# Patient Record
Sex: Male | Born: 1950
Health system: Southern US, Community
[De-identification: ages and names within clinical notes are randomized; demographics above are authoritative.]

## PROBLEM LIST (undated history)

## (undated) DIAGNOSIS — G4730 Sleep apnea, unspecified: Secondary | ICD-10-CM

## (undated) DIAGNOSIS — I4892 Unspecified atrial flutter: Secondary | ICD-10-CM

## (undated) DIAGNOSIS — E119 Type 2 diabetes mellitus without complications: Secondary | ICD-10-CM

## (undated) HISTORY — PX: OTHER SURGICAL HISTORY: SHX169

## (undated) HISTORY — DX: Sleep apnea, unspecified: G47.30

## (undated) HISTORY — DX: Type 2 diabetes mellitus without complications: E11.9

## (undated) HISTORY — PX: TONSILLECTOMY: SUR1361

## (undated) HISTORY — DX: Unspecified atrial flutter: I48.92

---

## 2010-07-08 ENCOUNTER — Other Ambulatory Visit (HOSPITAL_COMMUNITY): Payer: Self-pay | Admitting: Physical Medicine and Rehabilitation

## 2010-07-08 DIAGNOSIS — M545 Low back pain, unspecified: Secondary | ICD-10-CM

## 2010-07-16 ENCOUNTER — Other Ambulatory Visit (HOSPITAL_COMMUNITY): Payer: Self-pay

## 2010-09-10 ENCOUNTER — Other Ambulatory Visit: Payer: Self-pay | Admitting: Physical Medicine and Rehabilitation

## 2010-09-10 ENCOUNTER — Ambulatory Visit
Admission: RE | Admit: 2010-09-10 | Discharge: 2010-09-10 | Disposition: A | Discharge status: Home / Self Care | Payer: PRIVATE HEALTH INSURANCE | Source: Ambulatory Visit | Attending: Physical Medicine and Rehabilitation | Admitting: Physical Medicine and Rehabilitation

## 2010-09-10 DIAGNOSIS — IMO0002 Reserved for concepts with insufficient information to code with codable children: Secondary | ICD-10-CM

## 2016-04-11 DIAGNOSIS — R000 Tachycardia, unspecified: Secondary | ICD-10-CM | POA: Diagnosis not present

## 2016-04-11 DIAGNOSIS — Z23 Encounter for immunization: Secondary | ICD-10-CM | POA: Diagnosis not present

## 2016-04-11 DIAGNOSIS — E871 Hypo-osmolality and hyponatremia: Secondary | ICD-10-CM | POA: Diagnosis not present

## 2016-04-11 DIAGNOSIS — Z9114 Patient's other noncompliance with medication regimen: Secondary | ICD-10-CM | POA: Diagnosis not present

## 2016-04-11 DIAGNOSIS — R69 Illness, unspecified: Secondary | ICD-10-CM | POA: Diagnosis not present

## 2016-04-11 DIAGNOSIS — R05 Cough: Secondary | ICD-10-CM | POA: Diagnosis not present

## 2016-04-11 DIAGNOSIS — R1110 Vomiting, unspecified: Secondary | ICD-10-CM | POA: Diagnosis not present

## 2016-04-11 DIAGNOSIS — J189 Pneumonia, unspecified organism: Secondary | ICD-10-CM | POA: Diagnosis not present

## 2016-04-11 DIAGNOSIS — K5900 Constipation, unspecified: Secondary | ICD-10-CM | POA: Diagnosis not present

## 2016-04-11 DIAGNOSIS — E785 Hyperlipidemia, unspecified: Secondary | ICD-10-CM | POA: Diagnosis not present

## 2016-04-11 DIAGNOSIS — E1310 Other specified diabetes mellitus with ketoacidosis without coma: Secondary | ICD-10-CM | POA: Diagnosis not present

## 2016-04-11 DIAGNOSIS — R112 Nausea with vomiting, unspecified: Secondary | ICD-10-CM | POA: Diagnosis not present

## 2016-04-11 DIAGNOSIS — Z794 Long term (current) use of insulin: Secondary | ICD-10-CM | POA: Diagnosis not present

## 2016-04-11 DIAGNOSIS — R350 Frequency of micturition: Secondary | ICD-10-CM | POA: Diagnosis not present

## 2016-04-11 DIAGNOSIS — E1110 Type 2 diabetes mellitus with ketoacidosis without coma: Secondary | ICD-10-CM | POA: Diagnosis not present

## 2016-04-11 DIAGNOSIS — R809 Proteinuria, unspecified: Secondary | ICD-10-CM | POA: Diagnosis not present

## 2016-04-12 DIAGNOSIS — R112 Nausea with vomiting, unspecified: Secondary | ICD-10-CM | POA: Diagnosis not present

## 2016-04-12 DIAGNOSIS — J189 Pneumonia, unspecified organism: Secondary | ICD-10-CM | POA: Diagnosis not present

## 2016-04-12 DIAGNOSIS — R809 Proteinuria, unspecified: Secondary | ICD-10-CM | POA: Diagnosis not present

## 2016-04-12 DIAGNOSIS — E1310 Other specified diabetes mellitus with ketoacidosis without coma: Secondary | ICD-10-CM | POA: Diagnosis not present

## 2016-04-12 DIAGNOSIS — E871 Hypo-osmolality and hyponatremia: Secondary | ICD-10-CM | POA: Diagnosis not present

## 2016-04-13 DIAGNOSIS — R809 Proteinuria, unspecified: Secondary | ICD-10-CM | POA: Diagnosis not present

## 2016-04-13 DIAGNOSIS — E1310 Other specified diabetes mellitus with ketoacidosis without coma: Secondary | ICD-10-CM | POA: Diagnosis not present

## 2016-04-13 DIAGNOSIS — R112 Nausea with vomiting, unspecified: Secondary | ICD-10-CM | POA: Diagnosis not present

## 2016-04-13 DIAGNOSIS — E871 Hypo-osmolality and hyponatremia: Secondary | ICD-10-CM | POA: Diagnosis not present

## 2016-09-11 DIAGNOSIS — E1165 Type 2 diabetes mellitus with hyperglycemia: Secondary | ICD-10-CM | POA: Diagnosis not present

## 2016-09-11 DIAGNOSIS — E7800 Pure hypercholesterolemia, unspecified: Secondary | ICD-10-CM | POA: Diagnosis not present

## 2016-09-11 DIAGNOSIS — I10 Essential (primary) hypertension: Secondary | ICD-10-CM | POA: Diagnosis not present

## 2016-09-11 DIAGNOSIS — G4730 Sleep apnea, unspecified: Secondary | ICD-10-CM | POA: Diagnosis not present

## 2016-11-13 DIAGNOSIS — Z794 Long term (current) use of insulin: Secondary | ICD-10-CM | POA: Diagnosis not present

## 2016-11-13 DIAGNOSIS — Z888 Allergy status to other drugs, medicaments and biological substances status: Secondary | ICD-10-CM | POA: Diagnosis not present

## 2016-11-13 DIAGNOSIS — R112 Nausea with vomiting, unspecified: Secondary | ICD-10-CM | POA: Diagnosis not present

## 2016-11-13 DIAGNOSIS — Z23 Encounter for immunization: Secondary | ICD-10-CM | POA: Diagnosis not present

## 2016-11-13 DIAGNOSIS — Z720 Tobacco use: Secondary | ICD-10-CM | POA: Diagnosis not present

## 2016-11-13 DIAGNOSIS — R69 Illness, unspecified: Secondary | ICD-10-CM | POA: Diagnosis not present

## 2016-11-13 DIAGNOSIS — E1310 Other specified diabetes mellitus with ketoacidosis without coma: Secondary | ICD-10-CM | POA: Diagnosis not present

## 2016-11-13 DIAGNOSIS — E1110 Type 2 diabetes mellitus with ketoacidosis without coma: Secondary | ICD-10-CM | POA: Diagnosis not present

## 2016-11-13 DIAGNOSIS — E871 Hypo-osmolality and hyponatremia: Secondary | ICD-10-CM | POA: Diagnosis not present

## 2016-11-13 DIAGNOSIS — Z91128 Patient's intentional underdosing of medication regimen for other reason: Secondary | ICD-10-CM | POA: Diagnosis not present

## 2016-11-13 DIAGNOSIS — T50906A Underdosing of unspecified drugs, medicaments and biological substances, initial encounter: Secondary | ICD-10-CM | POA: Diagnosis not present

## 2016-11-13 DIAGNOSIS — Z79899 Other long term (current) drug therapy: Secondary | ICD-10-CM | POA: Diagnosis not present

## 2016-11-13 DIAGNOSIS — R05 Cough: Secondary | ICD-10-CM | POA: Diagnosis not present

## 2016-11-13 DIAGNOSIS — D72829 Elevated white blood cell count, unspecified: Secondary | ICD-10-CM | POA: Diagnosis not present

## 2016-11-25 DIAGNOSIS — Z0000 Encounter for general adult medical examination without abnormal findings: Secondary | ICD-10-CM | POA: Diagnosis not present

## 2016-11-25 DIAGNOSIS — E1165 Type 2 diabetes mellitus with hyperglycemia: Secondary | ICD-10-CM | POA: Diagnosis not present

## 2016-11-25 DIAGNOSIS — Z125 Encounter for screening for malignant neoplasm of prostate: Secondary | ICD-10-CM | POA: Diagnosis not present

## 2016-11-25 DIAGNOSIS — E559 Vitamin D deficiency, unspecified: Secondary | ICD-10-CM | POA: Diagnosis not present

## 2016-11-30 DIAGNOSIS — Z23 Encounter for immunization: Secondary | ICD-10-CM | POA: Diagnosis not present

## 2016-11-30 DIAGNOSIS — Z0001 Encounter for general adult medical examination with abnormal findings: Secondary | ICD-10-CM | POA: Diagnosis not present

## 2016-12-02 DIAGNOSIS — E1165 Type 2 diabetes mellitus with hyperglycemia: Secondary | ICD-10-CM | POA: Diagnosis not present

## 2016-12-02 DIAGNOSIS — R69 Illness, unspecified: Secondary | ICD-10-CM | POA: Diagnosis not present

## 2016-12-02 DIAGNOSIS — E7800 Pure hypercholesterolemia, unspecified: Secondary | ICD-10-CM | POA: Diagnosis not present

## 2016-12-02 DIAGNOSIS — I10 Essential (primary) hypertension: Secondary | ICD-10-CM | POA: Diagnosis not present

## 2016-12-03 DIAGNOSIS — R69 Illness, unspecified: Secondary | ICD-10-CM | POA: Diagnosis not present

## 2016-12-08 DIAGNOSIS — M50123 Cervical disc disorder at C6-C7 level with radiculopathy: Secondary | ICD-10-CM | POA: Diagnosis not present

## 2016-12-08 DIAGNOSIS — M5382 Other specified dorsopathies, cervical region: Secondary | ICD-10-CM | POA: Diagnosis not present

## 2016-12-08 DIAGNOSIS — G8929 Other chronic pain: Secondary | ICD-10-CM | POA: Diagnosis not present

## 2016-12-08 DIAGNOSIS — M542 Cervicalgia: Secondary | ICD-10-CM | POA: Diagnosis not present

## 2016-12-08 DIAGNOSIS — M545 Low back pain: Secondary | ICD-10-CM | POA: Diagnosis not present

## 2016-12-08 DIAGNOSIS — M5412 Radiculopathy, cervical region: Secondary | ICD-10-CM | POA: Diagnosis not present

## 2016-12-11 DIAGNOSIS — Z1211 Encounter for screening for malignant neoplasm of colon: Secondary | ICD-10-CM | POA: Diagnosis not present

## 2016-12-16 DIAGNOSIS — M5412 Radiculopathy, cervical region: Secondary | ICD-10-CM | POA: Diagnosis not present

## 2016-12-16 DIAGNOSIS — E1165 Type 2 diabetes mellitus with hyperglycemia: Secondary | ICD-10-CM | POA: Diagnosis not present

## 2016-12-16 DIAGNOSIS — M542 Cervicalgia: Secondary | ICD-10-CM | POA: Diagnosis not present

## 2016-12-16 DIAGNOSIS — G8929 Other chronic pain: Secondary | ICD-10-CM | POA: Diagnosis not present

## 2016-12-16 DIAGNOSIS — I10 Essential (primary) hypertension: Secondary | ICD-10-CM | POA: Diagnosis not present

## 2016-12-16 DIAGNOSIS — M545 Low back pain: Secondary | ICD-10-CM | POA: Diagnosis not present

## 2016-12-24 DIAGNOSIS — R69 Illness, unspecified: Secondary | ICD-10-CM | POA: Diagnosis not present

## 2017-01-01 DIAGNOSIS — M8589 Other specified disorders of bone density and structure, multiple sites: Secondary | ICD-10-CM | POA: Diagnosis not present

## 2017-01-01 DIAGNOSIS — J301 Allergic rhinitis due to pollen: Secondary | ICD-10-CM | POA: Diagnosis not present

## 2017-01-01 DIAGNOSIS — Z87891 Personal history of nicotine dependence: Secondary | ICD-10-CM | POA: Diagnosis not present

## 2017-01-01 DIAGNOSIS — R942 Abnormal results of pulmonary function studies: Secondary | ICD-10-CM | POA: Diagnosis not present

## 2017-01-01 DIAGNOSIS — R69 Illness, unspecified: Secondary | ICD-10-CM | POA: Diagnosis not present

## 2017-01-01 DIAGNOSIS — G4733 Obstructive sleep apnea (adult) (pediatric): Secondary | ICD-10-CM | POA: Diagnosis not present

## 2017-01-01 DIAGNOSIS — M859 Disorder of bone density and structure, unspecified: Secondary | ICD-10-CM | POA: Diagnosis not present

## 2017-01-18 DIAGNOSIS — M545 Low back pain: Secondary | ICD-10-CM | POA: Diagnosis not present

## 2017-01-18 DIAGNOSIS — G8929 Other chronic pain: Secondary | ICD-10-CM | POA: Diagnosis not present

## 2017-01-18 DIAGNOSIS — M5412 Radiculopathy, cervical region: Secondary | ICD-10-CM | POA: Diagnosis not present

## 2017-01-18 DIAGNOSIS — M542 Cervicalgia: Secondary | ICD-10-CM | POA: Diagnosis not present

## 2017-01-22 DIAGNOSIS — I10 Essential (primary) hypertension: Secondary | ICD-10-CM | POA: Diagnosis not present

## 2017-01-22 DIAGNOSIS — E559 Vitamin D deficiency, unspecified: Secondary | ICD-10-CM | POA: Diagnosis not present

## 2017-01-22 DIAGNOSIS — E1165 Type 2 diabetes mellitus with hyperglycemia: Secondary | ICD-10-CM | POA: Diagnosis not present

## 2017-01-25 DIAGNOSIS — M5412 Radiculopathy, cervical region: Secondary | ICD-10-CM | POA: Diagnosis not present

## 2017-01-25 DIAGNOSIS — G8929 Other chronic pain: Secondary | ICD-10-CM | POA: Diagnosis not present

## 2017-01-25 DIAGNOSIS — M545 Low back pain: Secondary | ICD-10-CM | POA: Diagnosis not present

## 2017-01-25 DIAGNOSIS — M542 Cervicalgia: Secondary | ICD-10-CM | POA: Diagnosis not present

## 2017-01-27 DIAGNOSIS — H9193 Unspecified hearing loss, bilateral: Secondary | ICD-10-CM | POA: Diagnosis not present

## 2017-01-27 DIAGNOSIS — H6123 Impacted cerumen, bilateral: Secondary | ICD-10-CM | POA: Diagnosis not present

## 2017-01-28 DIAGNOSIS — R0602 Shortness of breath: Secondary | ICD-10-CM | POA: Diagnosis not present

## 2017-01-28 DIAGNOSIS — M545 Low back pain: Secondary | ICD-10-CM | POA: Diagnosis not present

## 2017-01-28 DIAGNOSIS — M542 Cervicalgia: Secondary | ICD-10-CM | POA: Diagnosis not present

## 2017-01-28 DIAGNOSIS — R69 Illness, unspecified: Secondary | ICD-10-CM | POA: Diagnosis not present

## 2017-01-28 DIAGNOSIS — G8929 Other chronic pain: Secondary | ICD-10-CM | POA: Diagnosis not present

## 2017-01-28 DIAGNOSIS — E119 Type 2 diabetes mellitus without complications: Secondary | ICD-10-CM | POA: Diagnosis not present

## 2017-01-28 DIAGNOSIS — Z87891 Personal history of nicotine dependence: Secondary | ICD-10-CM | POA: Diagnosis not present

## 2017-01-28 DIAGNOSIS — M5412 Radiculopathy, cervical region: Secondary | ICD-10-CM | POA: Diagnosis not present

## 2017-02-02 DIAGNOSIS — H6993 Unspecified Eustachian tube disorder, bilateral: Secondary | ICD-10-CM | POA: Diagnosis not present

## 2017-02-02 DIAGNOSIS — H903 Sensorineural hearing loss, bilateral: Secondary | ICD-10-CM | POA: Diagnosis not present

## 2017-02-02 DIAGNOSIS — J300 Vasomotor rhinitis: Secondary | ICD-10-CM | POA: Diagnosis not present

## 2017-02-03 DIAGNOSIS — Z1211 Encounter for screening for malignant neoplasm of colon: Secondary | ICD-10-CM | POA: Diagnosis not present

## 2017-02-05 DIAGNOSIS — M5412 Radiculopathy, cervical region: Secondary | ICD-10-CM | POA: Diagnosis not present

## 2017-02-05 DIAGNOSIS — R69 Illness, unspecified: Secondary | ICD-10-CM | POA: Diagnosis not present

## 2017-02-05 DIAGNOSIS — M4316 Spondylolisthesis, lumbar region: Secondary | ICD-10-CM | POA: Diagnosis not present

## 2017-02-05 DIAGNOSIS — M545 Low back pain: Secondary | ICD-10-CM | POA: Diagnosis not present

## 2017-02-05 DIAGNOSIS — M5136 Other intervertebral disc degeneration, lumbar region: Secondary | ICD-10-CM | POA: Diagnosis not present

## 2017-02-05 DIAGNOSIS — M542 Cervicalgia: Secondary | ICD-10-CM | POA: Diagnosis not present

## 2017-02-05 DIAGNOSIS — G8929 Other chronic pain: Secondary | ICD-10-CM | POA: Diagnosis not present

## 2017-02-08 DIAGNOSIS — Z87891 Personal history of nicotine dependence: Secondary | ICD-10-CM | POA: Diagnosis not present

## 2017-02-12 DIAGNOSIS — J449 Chronic obstructive pulmonary disease, unspecified: Secondary | ICD-10-CM | POA: Diagnosis not present

## 2017-02-12 DIAGNOSIS — J301 Allergic rhinitis due to pollen: Secondary | ICD-10-CM | POA: Diagnosis not present

## 2017-02-14 DIAGNOSIS — M4804 Spinal stenosis, thoracic region: Secondary | ICD-10-CM | POA: Diagnosis not present

## 2017-02-14 DIAGNOSIS — M48061 Spinal stenosis, lumbar region without neurogenic claudication: Secondary | ICD-10-CM | POA: Diagnosis not present

## 2017-02-16 DIAGNOSIS — Z01818 Encounter for other preprocedural examination: Secondary | ICD-10-CM | POA: Diagnosis not present

## 2017-02-16 DIAGNOSIS — K635 Polyp of colon: Secondary | ICD-10-CM | POA: Diagnosis not present

## 2017-02-16 DIAGNOSIS — E119 Type 2 diabetes mellitus without complications: Secondary | ICD-10-CM | POA: Diagnosis not present

## 2017-02-16 DIAGNOSIS — Z86010 Personal history of colonic polyps: Secondary | ICD-10-CM | POA: Diagnosis not present

## 2017-02-26 DIAGNOSIS — E1165 Type 2 diabetes mellitus with hyperglycemia: Secondary | ICD-10-CM | POA: Diagnosis not present

## 2017-02-26 DIAGNOSIS — E559 Vitamin D deficiency, unspecified: Secondary | ICD-10-CM | POA: Diagnosis not present

## 2017-02-26 DIAGNOSIS — K635 Polyp of colon: Secondary | ICD-10-CM | POA: Diagnosis not present

## 2017-03-15 DIAGNOSIS — R69 Illness, unspecified: Secondary | ICD-10-CM | POA: Diagnosis not present

## 2017-03-15 DIAGNOSIS — G4730 Sleep apnea, unspecified: Secondary | ICD-10-CM | POA: Diagnosis not present

## 2017-03-15 DIAGNOSIS — E1165 Type 2 diabetes mellitus with hyperglycemia: Secondary | ICD-10-CM | POA: Diagnosis not present

## 2017-03-15 DIAGNOSIS — E559 Vitamin D deficiency, unspecified: Secondary | ICD-10-CM | POA: Diagnosis not present

## 2017-03-15 DIAGNOSIS — E7800 Pure hypercholesterolemia, unspecified: Secondary | ICD-10-CM | POA: Diagnosis not present

## 2017-03-15 DIAGNOSIS — I10 Essential (primary) hypertension: Secondary | ICD-10-CM | POA: Diagnosis not present

## 2017-03-16 DIAGNOSIS — K635 Polyp of colon: Secondary | ICD-10-CM | POA: Diagnosis not present

## 2017-03-22 DIAGNOSIS — M40202 Unspecified kyphosis, cervical region: Secondary | ICD-10-CM | POA: Diagnosis not present

## 2017-03-22 DIAGNOSIS — M5441 Lumbago with sciatica, right side: Secondary | ICD-10-CM | POA: Diagnosis not present

## 2017-03-22 DIAGNOSIS — M4722 Other spondylosis with radiculopathy, cervical region: Secondary | ICD-10-CM | POA: Diagnosis not present

## 2017-03-22 DIAGNOSIS — M47812 Spondylosis without myelopathy or radiculopathy, cervical region: Secondary | ICD-10-CM | POA: Diagnosis not present

## 2017-03-22 DIAGNOSIS — R69 Illness, unspecified: Secondary | ICD-10-CM | POA: Diagnosis not present

## 2017-03-22 DIAGNOSIS — G8929 Other chronic pain: Secondary | ICD-10-CM | POA: Diagnosis not present

## 2017-03-22 DIAGNOSIS — M545 Low back pain: Secondary | ICD-10-CM | POA: Diagnosis not present

## 2017-03-22 DIAGNOSIS — M4322 Fusion of spine, cervical region: Secondary | ICD-10-CM | POA: Diagnosis not present

## 2017-03-30 DIAGNOSIS — M7918 Myalgia, other site: Secondary | ICD-10-CM | POA: Diagnosis not present

## 2017-03-30 DIAGNOSIS — G894 Chronic pain syndrome: Secondary | ICD-10-CM | POA: Diagnosis not present

## 2017-03-30 DIAGNOSIS — M5441 Lumbago with sciatica, right side: Secondary | ICD-10-CM | POA: Diagnosis not present

## 2017-03-30 DIAGNOSIS — M47812 Spondylosis without myelopathy or radiculopathy, cervical region: Secondary | ICD-10-CM | POA: Diagnosis not present

## 2017-03-30 DIAGNOSIS — G8929 Other chronic pain: Secondary | ICD-10-CM | POA: Diagnosis not present

## 2017-03-30 DIAGNOSIS — M5416 Radiculopathy, lumbar region: Secondary | ICD-10-CM | POA: Diagnosis not present

## 2017-04-05 DIAGNOSIS — E7800 Pure hypercholesterolemia, unspecified: Secondary | ICD-10-CM | POA: Diagnosis not present

## 2017-04-05 DIAGNOSIS — E1165 Type 2 diabetes mellitus with hyperglycemia: Secondary | ICD-10-CM | POA: Diagnosis not present

## 2017-04-05 DIAGNOSIS — R69 Illness, unspecified: Secondary | ICD-10-CM | POA: Diagnosis not present

## 2017-04-05 DIAGNOSIS — G4730 Sleep apnea, unspecified: Secondary | ICD-10-CM | POA: Diagnosis not present

## 2017-04-05 DIAGNOSIS — I10 Essential (primary) hypertension: Secondary | ICD-10-CM | POA: Diagnosis not present

## 2017-04-05 DIAGNOSIS — E559 Vitamin D deficiency, unspecified: Secondary | ICD-10-CM | POA: Diagnosis not present

## 2017-04-07 DIAGNOSIS — B351 Tinea unguium: Secondary | ICD-10-CM | POA: Diagnosis not present

## 2017-04-07 DIAGNOSIS — Q809 Congenital ichthyosis, unspecified: Secondary | ICD-10-CM | POA: Diagnosis not present

## 2017-04-07 DIAGNOSIS — Z79899 Other long term (current) drug therapy: Secondary | ICD-10-CM | POA: Diagnosis not present

## 2017-04-13 DIAGNOSIS — Z79899 Other long term (current) drug therapy: Secondary | ICD-10-CM | POA: Diagnosis not present

## 2017-04-21 DIAGNOSIS — M47812 Spondylosis without myelopathy or radiculopathy, cervical region: Secondary | ICD-10-CM | POA: Diagnosis not present

## 2017-04-29 DIAGNOSIS — I10 Essential (primary) hypertension: Secondary | ICD-10-CM | POA: Diagnosis not present

## 2017-04-29 DIAGNOSIS — E1165 Type 2 diabetes mellitus with hyperglycemia: Secondary | ICD-10-CM | POA: Diagnosis not present

## 2017-04-29 DIAGNOSIS — E559 Vitamin D deficiency, unspecified: Secondary | ICD-10-CM | POA: Diagnosis not present

## 2017-05-24 DIAGNOSIS — M47812 Spondylosis without myelopathy or radiculopathy, cervical region: Secondary | ICD-10-CM | POA: Diagnosis not present

## 2017-05-24 DIAGNOSIS — Z888 Allergy status to other drugs, medicaments and biological substances status: Secondary | ICD-10-CM | POA: Diagnosis not present

## 2017-05-27 DIAGNOSIS — R0602 Shortness of breath: Secondary | ICD-10-CM | POA: Diagnosis not present

## 2017-05-27 DIAGNOSIS — J449 Chronic obstructive pulmonary disease, unspecified: Secondary | ICD-10-CM | POA: Diagnosis not present

## 2017-05-27 DIAGNOSIS — J301 Allergic rhinitis due to pollen: Secondary | ICD-10-CM | POA: Diagnosis not present

## 2017-05-27 DIAGNOSIS — Z87891 Personal history of nicotine dependence: Secondary | ICD-10-CM | POA: Diagnosis not present

## 2017-05-27 DIAGNOSIS — R69 Illness, unspecified: Secondary | ICD-10-CM | POA: Diagnosis not present

## 2017-06-16 DIAGNOSIS — J309 Allergic rhinitis, unspecified: Secondary | ICD-10-CM | POA: Diagnosis not present

## 2017-06-16 DIAGNOSIS — I10 Essential (primary) hypertension: Secondary | ICD-10-CM | POA: Diagnosis not present

## 2017-06-23 DIAGNOSIS — E1165 Type 2 diabetes mellitus with hyperglycemia: Secondary | ICD-10-CM | POA: Diagnosis not present

## 2017-06-23 DIAGNOSIS — E559 Vitamin D deficiency, unspecified: Secondary | ICD-10-CM | POA: Diagnosis not present

## 2017-06-23 DIAGNOSIS — J309 Allergic rhinitis, unspecified: Secondary | ICD-10-CM | POA: Diagnosis not present

## 2017-06-23 DIAGNOSIS — G4730 Sleep apnea, unspecified: Secondary | ICD-10-CM | POA: Diagnosis not present

## 2017-06-23 DIAGNOSIS — I10 Essential (primary) hypertension: Secondary | ICD-10-CM | POA: Diagnosis not present

## 2017-06-23 DIAGNOSIS — R69 Illness, unspecified: Secondary | ICD-10-CM | POA: Diagnosis not present

## 2017-06-23 DIAGNOSIS — E7800 Pure hypercholesterolemia, unspecified: Secondary | ICD-10-CM | POA: Diagnosis not present

## 2017-07-02 DIAGNOSIS — E119 Type 2 diabetes mellitus without complications: Secondary | ICD-10-CM | POA: Diagnosis not present

## 2017-07-02 DIAGNOSIS — I959 Hypotension, unspecified: Secondary | ICD-10-CM | POA: Diagnosis not present

## 2017-07-02 DIAGNOSIS — R69 Illness, unspecified: Secondary | ICD-10-CM | POA: Diagnosis not present

## 2017-07-02 DIAGNOSIS — I471 Supraventricular tachycardia: Secondary | ICD-10-CM | POA: Diagnosis not present

## 2017-07-02 DIAGNOSIS — Z794 Long term (current) use of insulin: Secondary | ICD-10-CM | POA: Diagnosis not present

## 2017-07-02 DIAGNOSIS — I4891 Unspecified atrial fibrillation: Secondary | ICD-10-CM | POA: Diagnosis not present

## 2017-07-02 DIAGNOSIS — M4712 Other spondylosis with myelopathy, cervical region: Secondary | ICD-10-CM | POA: Diagnosis not present

## 2017-07-02 DIAGNOSIS — M542 Cervicalgia: Secondary | ICD-10-CM | POA: Diagnosis not present

## 2017-07-02 DIAGNOSIS — I4892 Unspecified atrial flutter: Secondary | ICD-10-CM | POA: Diagnosis not present

## 2017-07-02 DIAGNOSIS — E860 Dehydration: Secondary | ICD-10-CM | POA: Diagnosis not present

## 2017-07-02 DIAGNOSIS — Z7901 Long term (current) use of anticoagulants: Secondary | ICD-10-CM | POA: Diagnosis not present

## 2017-07-02 DIAGNOSIS — G8929 Other chronic pain: Secondary | ICD-10-CM | POA: Diagnosis not present

## 2017-07-02 DIAGNOSIS — R9431 Abnormal electrocardiogram [ECG] [EKG]: Secondary | ICD-10-CM | POA: Diagnosis not present

## 2017-07-02 DIAGNOSIS — G4733 Obstructive sleep apnea (adult) (pediatric): Secondary | ICD-10-CM | POA: Diagnosis not present

## 2017-07-03 DIAGNOSIS — R9431 Abnormal electrocardiogram [ECG] [EKG]: Secondary | ICD-10-CM | POA: Diagnosis not present

## 2017-07-03 DIAGNOSIS — Z794 Long term (current) use of insulin: Secondary | ICD-10-CM | POA: Diagnosis not present

## 2017-07-03 DIAGNOSIS — E119 Type 2 diabetes mellitus without complications: Secondary | ICD-10-CM | POA: Diagnosis not present

## 2017-07-03 DIAGNOSIS — R69 Illness, unspecified: Secondary | ICD-10-CM | POA: Diagnosis not present

## 2017-07-03 DIAGNOSIS — I471 Supraventricular tachycardia: Secondary | ICD-10-CM | POA: Diagnosis not present

## 2017-07-03 DIAGNOSIS — M4712 Other spondylosis with myelopathy, cervical region: Secondary | ICD-10-CM | POA: Diagnosis not present

## 2017-07-03 DIAGNOSIS — G4733 Obstructive sleep apnea (adult) (pediatric): Secondary | ICD-10-CM | POA: Diagnosis not present

## 2017-07-03 DIAGNOSIS — Z7901 Long term (current) use of anticoagulants: Secondary | ICD-10-CM | POA: Diagnosis not present

## 2017-07-03 DIAGNOSIS — I4892 Unspecified atrial flutter: Secondary | ICD-10-CM | POA: Diagnosis not present

## 2017-07-08 DIAGNOSIS — Z09 Encounter for follow-up examination after completed treatment for conditions other than malignant neoplasm: Secondary | ICD-10-CM | POA: Diagnosis not present

## 2017-07-08 DIAGNOSIS — I4892 Unspecified atrial flutter: Secondary | ICD-10-CM | POA: Diagnosis not present

## 2017-07-14 DIAGNOSIS — J309 Allergic rhinitis, unspecified: Secondary | ICD-10-CM | POA: Diagnosis not present

## 2017-07-15 DIAGNOSIS — N390 Urinary tract infection, site not specified: Secondary | ICD-10-CM | POA: Diagnosis not present

## 2017-07-15 DIAGNOSIS — J019 Acute sinusitis, unspecified: Secondary | ICD-10-CM | POA: Diagnosis not present

## 2017-07-15 DIAGNOSIS — R05 Cough: Secondary | ICD-10-CM | POA: Diagnosis not present

## 2017-07-15 DIAGNOSIS — I4891 Unspecified atrial fibrillation: Secondary | ICD-10-CM | POA: Diagnosis not present

## 2017-07-23 DIAGNOSIS — E119 Type 2 diabetes mellitus without complications: Secondary | ICD-10-CM | POA: Diagnosis not present

## 2017-07-23 DIAGNOSIS — R112 Nausea with vomiting, unspecified: Secondary | ICD-10-CM | POA: Diagnosis not present

## 2017-07-23 DIAGNOSIS — I4892 Unspecified atrial flutter: Secondary | ICD-10-CM | POA: Diagnosis not present

## 2017-07-23 DIAGNOSIS — R1312 Dysphagia, oropharyngeal phase: Secondary | ICD-10-CM | POA: Diagnosis not present

## 2017-07-23 DIAGNOSIS — K5900 Constipation, unspecified: Secondary | ICD-10-CM | POA: Diagnosis not present

## 2017-07-23 DIAGNOSIS — G4733 Obstructive sleep apnea (adult) (pediatric): Secondary | ICD-10-CM | POA: Diagnosis not present

## 2017-07-23 DIAGNOSIS — Z720 Tobacco use: Secondary | ICD-10-CM | POA: Diagnosis not present

## 2017-07-23 DIAGNOSIS — Z7901 Long term (current) use of anticoagulants: Secondary | ICD-10-CM | POA: Diagnosis not present

## 2017-07-23 DIAGNOSIS — K6289 Other specified diseases of anus and rectum: Secondary | ICD-10-CM | POA: Diagnosis not present

## 2017-07-23 DIAGNOSIS — J690 Pneumonitis due to inhalation of food and vomit: Secondary | ICD-10-CM | POA: Diagnosis not present

## 2017-07-23 DIAGNOSIS — Z794 Long term (current) use of insulin: Secondary | ICD-10-CM | POA: Diagnosis not present

## 2017-07-23 DIAGNOSIS — E872 Acidosis: Secondary | ICD-10-CM | POA: Diagnosis not present

## 2017-07-23 DIAGNOSIS — E8889 Other specified metabolic disorders: Secondary | ICD-10-CM | POA: Diagnosis not present

## 2017-07-23 DIAGNOSIS — R918 Other nonspecific abnormal finding of lung field: Secondary | ICD-10-CM | POA: Diagnosis not present

## 2017-07-23 DIAGNOSIS — K8020 Calculus of gallbladder without cholecystitis without obstruction: Secondary | ICD-10-CM | POA: Diagnosis not present

## 2017-07-23 DIAGNOSIS — R5383 Other fatigue: Secondary | ICD-10-CM | POA: Diagnosis not present

## 2017-07-23 DIAGNOSIS — R69 Illness, unspecified: Secondary | ICD-10-CM | POA: Diagnosis not present

## 2017-07-23 DIAGNOSIS — I4891 Unspecified atrial fibrillation: Secondary | ICD-10-CM | POA: Diagnosis not present

## 2017-08-04 DIAGNOSIS — J309 Allergic rhinitis, unspecified: Secondary | ICD-10-CM | POA: Diagnosis not present

## 2017-08-05 DIAGNOSIS — E1165 Type 2 diabetes mellitus with hyperglycemia: Secondary | ICD-10-CM | POA: Diagnosis not present

## 2017-08-05 DIAGNOSIS — Z09 Encounter for follow-up examination after completed treatment for conditions other than malignant neoplasm: Secondary | ICD-10-CM | POA: Diagnosis not present

## 2017-08-05 DIAGNOSIS — E559 Vitamin D deficiency, unspecified: Secondary | ICD-10-CM | POA: Diagnosis not present

## 2017-08-05 DIAGNOSIS — I10 Essential (primary) hypertension: Secondary | ICD-10-CM | POA: Diagnosis not present

## 2017-08-05 DIAGNOSIS — J309 Allergic rhinitis, unspecified: Secondary | ICD-10-CM | POA: Diagnosis not present

## 2017-08-11 DIAGNOSIS — J309 Allergic rhinitis, unspecified: Secondary | ICD-10-CM | POA: Diagnosis not present

## 2017-08-16 DIAGNOSIS — I2510 Atherosclerotic heart disease of native coronary artery without angina pectoris: Secondary | ICD-10-CM | POA: Diagnosis not present

## 2017-08-16 DIAGNOSIS — D649 Anemia, unspecified: Secondary | ICD-10-CM | POA: Diagnosis not present

## 2017-08-16 DIAGNOSIS — K8020 Calculus of gallbladder without cholecystitis without obstruction: Secondary | ICD-10-CM | POA: Diagnosis not present

## 2017-08-16 DIAGNOSIS — K5900 Constipation, unspecified: Secondary | ICD-10-CM | POA: Diagnosis not present

## 2017-08-16 DIAGNOSIS — R69 Illness, unspecified: Secondary | ICD-10-CM | POA: Diagnosis not present

## 2017-08-16 DIAGNOSIS — Z8701 Personal history of pneumonia (recurrent): Secondary | ICD-10-CM | POA: Diagnosis not present

## 2017-08-16 DIAGNOSIS — R0982 Postnasal drip: Secondary | ICD-10-CM | POA: Diagnosis not present

## 2017-08-16 DIAGNOSIS — R0989 Other specified symptoms and signs involving the circulatory and respiratory systems: Secondary | ICD-10-CM | POA: Diagnosis not present

## 2017-08-17 DIAGNOSIS — J309 Allergic rhinitis, unspecified: Secondary | ICD-10-CM | POA: Diagnosis not present

## 2017-08-19 ENCOUNTER — Other Ambulatory Visit: Payer: Self-pay | Admitting: Acute Care

## 2017-08-19 DIAGNOSIS — Z122 Encounter for screening for malignant neoplasm of respiratory organs: Secondary | ICD-10-CM

## 2017-08-19 DIAGNOSIS — F17210 Nicotine dependence, cigarettes, uncomplicated: Secondary | ICD-10-CM

## 2017-08-23 DIAGNOSIS — J309 Allergic rhinitis, unspecified: Secondary | ICD-10-CM | POA: Diagnosis not present

## 2017-08-24 DIAGNOSIS — R69 Illness, unspecified: Secondary | ICD-10-CM | POA: Diagnosis not present

## 2017-08-24 DIAGNOSIS — E7800 Pure hypercholesterolemia, unspecified: Secondary | ICD-10-CM | POA: Diagnosis not present

## 2017-08-24 DIAGNOSIS — E538 Deficiency of other specified B group vitamins: Secondary | ICD-10-CM | POA: Diagnosis not present

## 2017-08-24 DIAGNOSIS — E1165 Type 2 diabetes mellitus with hyperglycemia: Secondary | ICD-10-CM | POA: Diagnosis not present

## 2017-08-24 DIAGNOSIS — G4730 Sleep apnea, unspecified: Secondary | ICD-10-CM | POA: Diagnosis not present

## 2017-08-24 DIAGNOSIS — E559 Vitamin D deficiency, unspecified: Secondary | ICD-10-CM | POA: Diagnosis not present

## 2017-08-24 DIAGNOSIS — I10 Essential (primary) hypertension: Secondary | ICD-10-CM | POA: Diagnosis not present

## 2017-08-25 DIAGNOSIS — R69 Illness, unspecified: Secondary | ICD-10-CM | POA: Diagnosis not present

## 2017-08-30 DIAGNOSIS — J309 Allergic rhinitis, unspecified: Secondary | ICD-10-CM | POA: Diagnosis not present

## 2017-08-31 DIAGNOSIS — L602 Onychogryphosis: Secondary | ICD-10-CM | POA: Diagnosis not present

## 2017-08-31 DIAGNOSIS — M2042 Other hammer toe(s) (acquired), left foot: Secondary | ICD-10-CM | POA: Diagnosis not present

## 2017-08-31 DIAGNOSIS — B351 Tinea unguium: Secondary | ICD-10-CM | POA: Diagnosis not present

## 2017-08-31 DIAGNOSIS — E1142 Type 2 diabetes mellitus with diabetic polyneuropathy: Secondary | ICD-10-CM | POA: Diagnosis not present

## 2017-08-31 DIAGNOSIS — M2041 Other hammer toe(s) (acquired), right foot: Secondary | ICD-10-CM | POA: Diagnosis not present

## 2017-09-01 DIAGNOSIS — R69 Illness, unspecified: Secondary | ICD-10-CM | POA: Diagnosis not present

## 2017-09-05 DIAGNOSIS — R69 Illness, unspecified: Secondary | ICD-10-CM | POA: Diagnosis not present

## 2017-09-06 DIAGNOSIS — E559 Vitamin D deficiency, unspecified: Secondary | ICD-10-CM | POA: Diagnosis not present

## 2017-09-06 DIAGNOSIS — I10 Essential (primary) hypertension: Secondary | ICD-10-CM | POA: Diagnosis not present

## 2017-09-07 DIAGNOSIS — J309 Allergic rhinitis, unspecified: Secondary | ICD-10-CM | POA: Diagnosis not present

## 2017-09-14 DIAGNOSIS — I10 Essential (primary) hypertension: Secondary | ICD-10-CM | POA: Diagnosis not present

## 2017-09-14 DIAGNOSIS — E559 Vitamin D deficiency, unspecified: Secondary | ICD-10-CM | POA: Diagnosis not present

## 2017-09-14 DIAGNOSIS — E1165 Type 2 diabetes mellitus with hyperglycemia: Secondary | ICD-10-CM | POA: Diagnosis not present

## 2017-09-14 DIAGNOSIS — J309 Allergic rhinitis, unspecified: Secondary | ICD-10-CM | POA: Diagnosis not present

## 2017-09-16 ENCOUNTER — Telehealth: Payer: Self-pay

## 2017-09-16 NOTE — Telephone Encounter (Signed)
Faxed new patient referral notes to [facility].

## 2017-09-20 ENCOUNTER — Encounter: Payer: PRIVATE HEALTH INSURANCE | Admitting: Acute Care

## 2017-09-20 ENCOUNTER — Inpatient Hospital Stay: Admission: RE | Admit: 2017-09-20 | Payer: PRIVATE HEALTH INSURANCE | Source: Ambulatory Visit

## 2017-09-22 DIAGNOSIS — J309 Allergic rhinitis, unspecified: Secondary | ICD-10-CM | POA: Diagnosis not present

## 2017-09-28 DIAGNOSIS — J309 Allergic rhinitis, unspecified: Secondary | ICD-10-CM | POA: Diagnosis not present

## 2017-09-29 ENCOUNTER — Encounter: Payer: Self-pay | Admitting: Acute Care

## 2017-09-29 ENCOUNTER — Ambulatory Visit (INDEPENDENT_AMBULATORY_CARE_PROVIDER_SITE_OTHER): Payer: Medicare HMO | Admitting: Acute Care

## 2017-09-29 ENCOUNTER — Ambulatory Visit (INDEPENDENT_AMBULATORY_CARE_PROVIDER_SITE_OTHER)
Admission: RE | Admit: 2017-09-29 | Discharge: 2017-09-29 | Disposition: A | Discharge status: Home / Self Care | Payer: Medicare HMO | Source: Ambulatory Visit | Attending: Acute Care | Admitting: Acute Care

## 2017-09-29 DIAGNOSIS — R69 Illness, unspecified: Secondary | ICD-10-CM | POA: Diagnosis not present

## 2017-09-29 DIAGNOSIS — F17210 Nicotine dependence, cigarettes, uncomplicated: Secondary | ICD-10-CM

## 2017-09-29 DIAGNOSIS — Z122 Encounter for screening for malignant neoplasm of respiratory organs: Secondary | ICD-10-CM

## 2017-09-29 NOTE — Progress Notes (Signed)
Shared Decision Making Visit Lung Cancer Screening Program [phone])   Eligibility:  Age 67 y.o.  Pack Years Smoking History Calculation 50 pack year smoking history (# packs/per year x # years smoked)  Recent History of coughing up blood  no  Unexplained weight loss? no ( >Than 15 pounds within the last 6 months )  Prior History Lung / other cancer no (Diagnosis within the last 5 years already requiring surveillance chest CT Scans).  Smoking Status Current Smoker  Former Smokers: Years since quit:NA  Quit Date: NA  Visit Components:  Discussion included one or more decision making aids. yes  Discussion included risk/benefits of screening. yes  Discussion included potential follow up diagnostic testing for abnormal scans. yes  Discussion included meaning and risk of over diagnosis. yes  Discussion included meaning and risk of False Positives. yes  Discussion included meaning of total radiation exposure. yes  Counseling Included:  Importance of adherence to annual lung cancer LDCT screening. yes  Impact of comorbidities on ability to participate in the program. yes  Ability and willingness to under diagnostic treatment. yes  Smoking Cessation Counseling:  Current Smokers:   Discussed importance of smoking cessation. yes  Information about tobacco cessation classes and interventions provided to patient. yes  Patient provided with "ticket" for LDCT Scan. yes  Symptomatic Patient. no  CounselingNA  Diagnosis Code: Tobacco Use Z72.0  Asymptomatic Patient yes  Counseling (Intermediate counseling: > three minutes counseling) G3358  Former Smokers:   Discussed the importance of maintaining cigarette abstinence. yes  Diagnosis Code: Personal History of Nicotine Dependence. I51.898  Information about tobacco cessation classes and interventions provided to patient. Yes  Patient provided with "ticket" for LDCT Scan. yes  Written Order for Lung Cancer  Screening with LDCT placed in Epic. Yes (CT Chest Lung Cancer Screening Low Dose W/O CM) [id number] Z12.2-Screening of respiratory organs Z87.891-Personal history of nicotine dependence  I have spent 25 minutes of face to face time with Eric Curry discussing the risks and benefits of lung cancer screening. We viewed a power point together that explained in detail the above noted topics. We paused at intervals to allow for questions to be asked and answered to ensure understanding.We discussed that the single most powerful action that he can take to decrease his risk of developing lung cancer is to quit smoking. We discussed whether or not he is ready to commit to setting a quit date. We discussed options for tools to aid in quitting smoking including nicotine replacement therapy, non-nicotine medications, support groups, Quit Smart classes, and behavior modification. We discussed that often times setting smaller, more achievable goals, such as eliminating 1 cigarette a day for a week and then 2 cigarettes a day for a week can be helpful in slowly decreasing the number of cigarettes smoked. This allows for a sense of accomplishment as well as providing a clinical benefit. I gave him the " Be Stronger Than Your Excuses" card with contact information for community resources, classes, free nicotine replacement therapy, and access to mobile apps, text messaging, and on-line smoking cessation help. I have also given him my card and contact information in the event he needs to contact me. We discussed the time and location of the scan, and that either Abigail Miyamoto RN or I will call with the results within 24-48 hours of receiving them. I have offered him  a copy of the power point we viewed  as a resource in the event they need reinforcement of the  concepts we discussed today in the office. The patient verbalized understanding of all of  the above and had no further questions upon leaving the office. They have my  contact information in the event they have any further questions.  I spent 5 minutes counseling on smoking cessation and the health risks of continued tobacco abuse.  I explained to the patient that there has been a high incidence of coronary artery disease noted on these exams. I explained that this is a non-gated exam therefore degree or severity cannot be determined. It is unknown if patient is on statin therapy. I have asked the patient to follow-up with their PCP regarding any incidental finding of coronary artery disease and management with diet or medication as their PCP  feels is clinically indicated. The patient verbalized understanding of the above and had no further questions upon completion of the visit.    Bevelyn Ngo, NP 09/29/2017 12:30 PM

## 2017-10-01 ENCOUNTER — Other Ambulatory Visit: Payer: Self-pay | Admitting: Acute Care

## 2017-10-01 DIAGNOSIS — Z122 Encounter for screening for malignant neoplasm of respiratory organs: Secondary | ICD-10-CM

## 2017-10-01 DIAGNOSIS — F17210 Nicotine dependence, cigarettes, uncomplicated: Secondary | ICD-10-CM

## 2017-10-04 DIAGNOSIS — R69 Illness, unspecified: Secondary | ICD-10-CM | POA: Diagnosis not present

## 2017-10-05 NOTE — Progress Notes (Addendum)
Audry Pili NP Reason for referral-coronary artery disease and atrial flutter  HPI: 67 year old male for evaluation of coronary artery disease/coronary calcification noted on CT scan at the request of Eric Form NP.  Echocardiogram at [facility] June 2019 showed normal LV function and mild left ventricular hypertrophy.  Chest CT performed for lung cancer screening on September 29, 2017 showed three-vessel coronary atherosclerosis. Pt admitted to [facility] 6/19 with atrial flutter (found incidentally on PE). Pt converted following initiation of cardizem. Pt placed on xarelto and DCed. TSH 0.963.  Patient states that on the day he was diagnosed with atrial flutter he was to receive shots in his neck.  The heart rate was noted to be elevated on vitals.  Otherwise he did not have palpitations, dyspnea, chest pain.  Note he has dyspnea with more vigorous activities but denies orthopnea, PND, palpitations or exertional chest pain.  No syncope.  Occasional minimal pedal edema.  Cardiology now asked to evaluate.  Current Outpatient Medications  Medication Sig Dispense Refill  . BD PEN NEEDLE NANO U/F 32G X 4 MM MISC USE 1 TO INJECT INSULIN SUBCUTANEOUSLY TWICE DAILY  1  . Blood Glucose Monitoring Suppl (ONETOUCH VERIO) w/Device KIT     . diltiazem (CARDIZEM CD) 120 MG 24 hr capsule Take 120 mg by mouth daily.    . Dulaglutide (TRULICITY) 1.5 NL/8.9QJ SOPN Inject 1.5 Units into the skin once a week.    Marland Kitchen EPINEPHrine 0.3 mg/0.3 mL IJ SOAJ injection See admin instructions.  1  . gabapentin (NEURONTIN) 300 MG capsule Take 300 mg by mouth 3 (three) times daily.  2  . Insulin Degludec (TRESIBA FLEXTOUCH) 200 UNIT/ML SOPN Inject 50 Units into the skin daily.    Marland Kitchen JARDIANCE 10 MG TABS tablet Take 10 mg by mouth daily.  3  . metFORMIN (GLUCOPHAGE) 1000 MG tablet Take 1,000 mg by mouth 2 (two) times daily with a meal.  3  . ONE TOUCH ULTRA TEST test strip USE 1 STRIP TO CHECK GLUCOSE THREE TIMES  DAILY  1  . ONETOUCH DELICA LANCETS 19E MISC USE 1 LANCET TO CHECK GLUCOSE THREE TIMES DAILY  1  . rivaroxaban (XARELTO) 20 MG TABS tablet Take 20 mg by mouth daily.    . sodium chloride (OCEAN) 0.65 % nasal spray Place 2 sprays into the nose daily.    Marland Kitchen umeclidinium-vilanterol (ANORO ELLIPTA) 62.5-25 MCG/INH AEPB Inhale 1 puff into the lungs daily.     No current facility-administered medications for this visit.     Allergies  Allergen Reactions  . Banana Anaphylaxis  . Peanut Butter Flavor Anaphylaxis  . Peanut Oil Anaphylaxis  . Other Nausea And Vomiting  . Sitagliptin Other (See Comments)    Dry mouth Dry mouth Severe xerostomia      Past Medical History:  Diagnosis Date  . Atrial flutter ([place])   . DM (diabetes mellitus) ([place])   . Sleep apnea     Past Surgical History:  Procedure Laterality Date  . Arthroscopic knee surgery    . Arthroscopic shoulder surgery    . TONSILLECTOMY      Social History   Socioeconomic History  . Marital status: Married    Spouse name: Not on file  . Number of children: 3  . Years of education: Not on file  . Highest education level: Not on file  Occupational History  . Not on file  Social Needs  . Financial resource strain: Not on file  . Food insecurity:  Worry: Not on file    Inability: Not on file  . Transportation needs:    Medical: Not on file    Non-medical: Not on file  Tobacco Use  . Smoking status: Current Every Day Smoker    Packs/day: 1.00    Years: 50.00    Pack years: 50.00    Types: Cigarettes  . Smokeless tobacco: Never Used  Substance and Sexual Activity  . Alcohol use: Not Currently  . Drug use: Not on file  . Sexual activity: Not on file  Lifestyle  . Physical activity:    Days per week: Not on file    Minutes per session: Not on file  . Stress: Not on file  Relationships  . Social connections:    Talks on phone: Not on file    Gets together: Not on file    Attends religious service: Not on  file    Active member of club or organization: Not on file    Attends meetings of clubs or organizations: Not on file    Relationship status: Not on file  . Intimate partner violence:    Fear of current or ex partner: Not on file    Emotionally abused: Not on file    Physically abused: Not on file    Forced sexual activity: Not on file  Other Topics Concern  . Not on file  Social History Narrative  . Not on file    Family History  Problem Relation Age of Onset  . Arthritis Mother   . Diabetes Father   . Arrhythmia Father     ROS: Chronic cough, back and neck pain but no fevers or chills, hemoptysis, dysphasia, odynophagia, melena, hematochezia, dysuria, hematuria, rash, seizure activity, orthopnea, PND, pedal edema, claudication. Remaining systems are negative.  Physical Exam:   Blood pressure (!) 109/59, pulse 96, height _0  (1.803 m), weight 188 lb 12.8 oz (85.6 kg).  General:  Well developed/well nourished in NAD Skin warm/dry Patient not depressed No peripheral clubbing Back-normal HEENT-normal/normal eyelids Neck supple/normal carotid upstroke bilaterally; no bruits; no JVD; no thyromegaly chest - CTA/ normal expansion CV - RRR/normal S1 and S2; no murmurs, rubs or gallops;  PMI nondisplaced Abdomen -NT/ND, no HSM, no mass, + bowel sounds, no bruit 2+ femoral pulses, no bruits Ext-no edema, chordss Neuro-grossly nonfocal  ECG -sinus rhythm at a rate of 96.  No ST changes.  Personally reviewed  A/P  1 atrial flutter-patient apparently had asymptomatic atrial flutter when he was admitted to [facility].  He converted spontaneously to sinus rhythm with Cardizem.  We will continue Cardizem for rate control if atrial fibrillation recurs.  We will continue Xarelto (CHADSvasc 3).  I will obtain electrocardiograms from [facility].  If he indeed has typical flutter I will ask electrophysiology to evaluate the patient for ablation and then he could be off of  anticoagulation long-term.  2 coronary artery disease-patient denies chest pain.  Significant calcium on CT scan.  I will arrange a [place] nuclear study for risk stratification.  I will not add aspirin given need for anticoagulation.  Add Crestor 40 mg daily.  Check lipids and liver in 4 weeks.  3 tobacco abuse-patient counseled on discontinuing.  4 diabetes mellitus-Per primary care.  Kirk Ruths, MD   10/18/17 I have obtained outside records from [facility].  Apparently the patient was noted to be tachycardic at pain management clinic and sent to the emergency room.  His initial pulse was 182.  In reviewing his electrocardiogram he appears to have a long RP tachycardia.  Based on notes he was given adenosine with a subsequent electrocardiogram showing atrial flutter.  I do not have the atrial flutter ECG for review.  He was then given Cardizem and he converted to sinus rhythm.  I would like to avoid anticoagulation long-term.  I will therefore ask him to be seen by electrophysiology for further evaluation.  Question if anticoagulation needs to be continued if presenting rhythm was SVT and converted to atrial flutter with adenosine.  Would also like to review whether ablation would be beneficial for atrial flutter to avoid long-term anticoagulation.  Kirk Ruths, MD

## 2017-10-06 ENCOUNTER — Encounter

## 2017-10-06 ENCOUNTER — Encounter: Payer: Self-pay | Admitting: Cardiology

## 2017-10-06 ENCOUNTER — Ambulatory Visit (INDEPENDENT_AMBULATORY_CARE_PROVIDER_SITE_OTHER): Payer: Medicare HMO | Admitting: Cardiology

## 2017-10-06 VITALS — BP 109/59 | HR 96 | Ht 71.0 in | Wt 188.8 lb

## 2017-10-06 DIAGNOSIS — Z720 Tobacco use: Secondary | ICD-10-CM | POA: Diagnosis not present

## 2017-10-06 DIAGNOSIS — I25119 Atherosclerotic heart disease of native coronary artery with unspecified angina pectoris: Secondary | ICD-10-CM | POA: Diagnosis not present

## 2017-10-06 DIAGNOSIS — I4892 Unspecified atrial flutter: Secondary | ICD-10-CM

## 2017-10-06 DIAGNOSIS — Z79899 Other long term (current) drug therapy: Secondary | ICD-10-CM

## 2017-10-06 DIAGNOSIS — M47816 Spondylosis without myelopathy or radiculopathy, lumbar region: Secondary | ICD-10-CM | POA: Diagnosis not present

## 2017-10-06 DIAGNOSIS — M47812 Spondylosis without myelopathy or radiculopathy, cervical region: Secondary | ICD-10-CM | POA: Diagnosis not present

## 2017-10-06 MED ORDER — ROSUVASTATIN CALCIUM 40 MG PO TABS: 40.0000 mg | ORAL_TABLET | Freq: Every day | ORAL | 3 refills | Status: DC

## 2017-10-06 NOTE — Patient Instructions (Addendum)
Medication Instructions: Your Physician recommend you make the following changes to your medication. Start: Crestor 40 mg daily   If you need a refill on your cardiac medications before your next appointment, please call your pharmacy.   Labwork: Your physician recommends that you return for lab work in 4 weeks (Lipid, LFT)   Procedures/Testing: Your physician has requested that you have a lexiscan myoview. For further information please visit [email or web address] Please follow instruction sheet, as given. [address]  Follow-Up: Your physician wants you to follow-up in 3 months with Dr. Jens Som.  Special Instructions:      Thank you for choosing [facility] at [facility]!!

## 2017-10-12 ENCOUNTER — Telehealth (HOSPITAL_COMMUNITY): Payer: Self-pay | Admitting: *Deleted

## 2017-10-12 DIAGNOSIS — J309 Allergic rhinitis, unspecified: Secondary | ICD-10-CM | POA: Diagnosis not present

## 2017-10-12 NOTE — Telephone Encounter (Signed)
Left message on voicemail in reference to upcoming appointment scheduled for 10/15/17. Phone number given for a call back so details instructions can be given. Leigh Kaeding Jacqueline   

## 2017-10-13 ENCOUNTER — Telehealth (HOSPITAL_COMMUNITY): Payer: Self-pay | Admitting: *Deleted

## 2017-10-13 NOTE — Telephone Encounter (Signed)
Patient given detailed instructions per Myocardial Perfusion Study Information Sheet for the test on 10/15/2017 at 7:45. Patient notified to arrive 15 minutes early and that it is imperative to arrive on time for appointment to keep from having the test rescheduled.  If you need to cancel or reschedule your appointment, please call the office within 24 hours of your appointment. . Patient verbalized understanding.Daneil DolinSharon S Brooks

## 2017-10-14 DIAGNOSIS — J309 Allergic rhinitis, unspecified: Secondary | ICD-10-CM | POA: Diagnosis not present

## 2017-10-15 ENCOUNTER — Ambulatory Visit (HOSPITAL_COMMUNITY): Payer: Medicare HMO | Attending: Cardiovascular Disease

## 2017-10-15 DIAGNOSIS — I25119 Atherosclerotic heart disease of native coronary artery with unspecified angina pectoris: Secondary | ICD-10-CM

## 2017-10-15 LAB — MYOCARDIAL PERFUSION IMAGING
CHL CUP RESTING HR STRESS: 71 {beats}/min
LVDIAVOL: 63 mL (ref 62–150)
LVSYSVOL: 24 mL
NUC STRESS TID: 1.03
Peak HR: 92 {beats}/min
SDS: 0
SRS: 0
SSS: 0

## 2017-10-15 MED ORDER — REGADENOSON 0.4 MG/5ML IV SOLN
0.4000 mg | Freq: Once | INTRAVENOUS | Status: AC
  Administered 2017-10-15: 0.4 mg via INTRAVENOUS

## 2017-10-15 MED ORDER — TECHNETIUM TC 99M TETROFOSMIN IV KIT
10.4000 | PACK | Freq: Once | INTRAVENOUS | Status: AC | PRN
  Administered 2017-10-15: 10.4 via INTRAVENOUS
  Filled 2017-10-15: qty 11

## 2017-10-15 MED ORDER — TECHNETIUM TC 99M TETROFOSMIN IV KIT
32.4000 | PACK | Freq: Once | INTRAVENOUS | Status: AC | PRN
  Administered 2017-10-15: 32.4 via INTRAVENOUS
  Filled 2017-10-15: qty 33

## 2017-10-18 DIAGNOSIS — M47812 Spondylosis without myelopathy or radiculopathy, cervical region: Secondary | ICD-10-CM | POA: Diagnosis not present

## 2017-10-18 DIAGNOSIS — R69 Illness, unspecified: Secondary | ICD-10-CM | POA: Diagnosis not present

## 2017-10-18 DIAGNOSIS — M40292 Other kyphosis, cervical region: Secondary | ICD-10-CM | POA: Diagnosis not present

## 2017-10-18 DIAGNOSIS — M545 Low back pain: Secondary | ICD-10-CM | POA: Diagnosis not present

## 2017-10-19 ENCOUNTER — Telehealth: Payer: Self-pay | Admitting: *Deleted

## 2017-10-19 DIAGNOSIS — R942 Abnormal results of pulmonary function studies: Secondary | ICD-10-CM | POA: Diagnosis not present

## 2017-10-19 DIAGNOSIS — J309 Allergic rhinitis, unspecified: Secondary | ICD-10-CM | POA: Diagnosis not present

## 2017-10-19 DIAGNOSIS — J449 Chronic obstructive pulmonary disease, unspecified: Secondary | ICD-10-CM | POA: Diagnosis not present

## 2017-10-19 DIAGNOSIS — Z6826 Body mass index (BMI) 26.0-26.9, adult: Secondary | ICD-10-CM | POA: Diagnosis not present

## 2017-10-19 DIAGNOSIS — R0602 Shortness of breath: Secondary | ICD-10-CM | POA: Diagnosis not present

## 2017-10-19 NOTE — Telephone Encounter (Signed)
Received records from previos cardiologist. Records given to medical records to hold for appt with dr Jens Somcrenshaw 01/12/18.

## 2017-10-25 DIAGNOSIS — L218 Other seborrheic dermatitis: Secondary | ICD-10-CM | POA: Diagnosis not present

## 2017-10-25 DIAGNOSIS — B351 Tinea unguium: Secondary | ICD-10-CM | POA: Diagnosis not present

## 2017-10-25 DIAGNOSIS — Q809 Congenital ichthyosis, unspecified: Secondary | ICD-10-CM | POA: Diagnosis not present

## 2017-10-27 DIAGNOSIS — M47812 Spondylosis without myelopathy or radiculopathy, cervical region: Secondary | ICD-10-CM | POA: Diagnosis not present

## 2017-11-01 DIAGNOSIS — J309 Allergic rhinitis, unspecified: Secondary | ICD-10-CM | POA: Diagnosis not present

## 2017-11-15 DIAGNOSIS — J309 Allergic rhinitis, unspecified: Secondary | ICD-10-CM | POA: Diagnosis not present

## 2017-11-19 DIAGNOSIS — M47812 Spondylosis without myelopathy or radiculopathy, cervical region: Secondary | ICD-10-CM | POA: Diagnosis not present

## 2017-11-29 DIAGNOSIS — G4730 Sleep apnea, unspecified: Secondary | ICD-10-CM | POA: Diagnosis not present

## 2017-11-29 DIAGNOSIS — I10 Essential (primary) hypertension: Secondary | ICD-10-CM | POA: Diagnosis not present

## 2017-11-29 DIAGNOSIS — I4891 Unspecified atrial fibrillation: Secondary | ICD-10-CM | POA: Diagnosis not present

## 2017-11-29 DIAGNOSIS — J309 Allergic rhinitis, unspecified: Secondary | ICD-10-CM | POA: Diagnosis not present

## 2017-11-29 DIAGNOSIS — E1165 Type 2 diabetes mellitus with hyperglycemia: Secondary | ICD-10-CM | POA: Diagnosis not present

## 2017-11-29 DIAGNOSIS — Z23 Encounter for immunization: Secondary | ICD-10-CM | POA: Diagnosis not present

## 2017-11-29 DIAGNOSIS — I2510 Atherosclerotic heart disease of native coronary artery without angina pectoris: Secondary | ICD-10-CM | POA: Diagnosis not present

## 2017-12-03 DIAGNOSIS — I10 Essential (primary) hypertension: Secondary | ICD-10-CM | POA: Diagnosis not present

## 2017-12-03 DIAGNOSIS — Z125 Encounter for screening for malignant neoplasm of prostate: Secondary | ICD-10-CM | POA: Diagnosis not present

## 2017-12-03 DIAGNOSIS — E1165 Type 2 diabetes mellitus with hyperglycemia: Secondary | ICD-10-CM | POA: Diagnosis not present

## 2017-12-03 DIAGNOSIS — E559 Vitamin D deficiency, unspecified: Secondary | ICD-10-CM | POA: Diagnosis not present

## 2017-12-06 DIAGNOSIS — J309 Allergic rhinitis, unspecified: Secondary | ICD-10-CM | POA: Diagnosis not present

## 2017-12-06 DIAGNOSIS — R69 Illness, unspecified: Secondary | ICD-10-CM | POA: Diagnosis not present

## 2017-12-07 DIAGNOSIS — E1165 Type 2 diabetes mellitus with hyperglycemia: Secondary | ICD-10-CM | POA: Diagnosis not present

## 2017-12-07 DIAGNOSIS — I10 Essential (primary) hypertension: Secondary | ICD-10-CM | POA: Diagnosis not present

## 2017-12-07 DIAGNOSIS — M4802 Spinal stenosis, cervical region: Secondary | ICD-10-CM | POA: Diagnosis not present

## 2017-12-07 DIAGNOSIS — R69 Illness, unspecified: Secondary | ICD-10-CM | POA: Diagnosis not present

## 2017-12-07 DIAGNOSIS — H532 Diplopia: Secondary | ICD-10-CM | POA: Diagnosis not present

## 2017-12-07 DIAGNOSIS — Z8679 Personal history of other diseases of the circulatory system: Secondary | ICD-10-CM | POA: Diagnosis not present

## 2017-12-07 DIAGNOSIS — G4730 Sleep apnea, unspecified: Secondary | ICD-10-CM | POA: Diagnosis not present

## 2017-12-07 DIAGNOSIS — Z0000 Encounter for general adult medical examination without abnormal findings: Secondary | ICD-10-CM | POA: Diagnosis not present

## 2017-12-07 DIAGNOSIS — M6208 Separation of muscle (nontraumatic), other site: Secondary | ICD-10-CM | POA: Diagnosis not present

## 2017-12-07 DIAGNOSIS — H903 Sensorineural hearing loss, bilateral: Secondary | ICD-10-CM | POA: Diagnosis not present

## 2017-12-10 DIAGNOSIS — M47812 Spondylosis without myelopathy or radiculopathy, cervical region: Secondary | ICD-10-CM | POA: Diagnosis not present

## 2017-12-14 DIAGNOSIS — J309 Allergic rhinitis, unspecified: Secondary | ICD-10-CM | POA: Diagnosis not present

## 2017-12-20 DIAGNOSIS — G4710 Hypersomnia, unspecified: Secondary | ICD-10-CM | POA: Diagnosis not present

## 2017-12-20 DIAGNOSIS — I4892 Unspecified atrial flutter: Secondary | ICD-10-CM | POA: Diagnosis not present

## 2017-12-20 DIAGNOSIS — Z9109 Other allergy status, other than to drugs and biological substances: Secondary | ICD-10-CM | POA: Diagnosis not present

## 2017-12-20 DIAGNOSIS — J449 Chronic obstructive pulmonary disease, unspecified: Secondary | ICD-10-CM | POA: Diagnosis not present

## 2017-12-21 DIAGNOSIS — M47812 Spondylosis without myelopathy or radiculopathy, cervical region: Secondary | ICD-10-CM | POA: Diagnosis not present

## 2017-12-21 DIAGNOSIS — M5136 Other intervertebral disc degeneration, lumbar region: Secondary | ICD-10-CM | POA: Diagnosis not present

## 2017-12-21 DIAGNOSIS — J309 Allergic rhinitis, unspecified: Secondary | ICD-10-CM | POA: Diagnosis not present

## 2017-12-21 DIAGNOSIS — M461 Sacroiliitis, not elsewhere classified: Secondary | ICD-10-CM | POA: Diagnosis not present

## 2017-12-21 DIAGNOSIS — M47816 Spondylosis without myelopathy or radiculopathy, lumbar region: Secondary | ICD-10-CM | POA: Diagnosis not present

## 2017-12-27 DIAGNOSIS — J309 Allergic rhinitis, unspecified: Secondary | ICD-10-CM | POA: Diagnosis not present

## 2017-12-27 DIAGNOSIS — M461 Sacroiliitis, not elsewhere classified: Secondary | ICD-10-CM | POA: Diagnosis not present

## 2018-01-03 NOTE — Progress Notes (Addendum)
HPI: FU coronary artery disease/coronary calcification noted on CT scan.  Echocardiogram at [facility] June 2019 showed normal LV function and mild left ventricular hypertrophy.  Chest CT performed for lung cancer screening on September 29, 2017 showed three-vessel coronary atherosclerosis. Pt admitted to [facility] 6/19 with atrial flutter (found incidentally on PE; no symptoms).  I reviewed this electrocardiogram and it appeared to show a long RP tachycardia.  Patient given adenosine and then by report developed atrial flutter (strips not available).  Pt converted following initiation of cardizem. Pt placed on xarelto and DCed. TSH 0.963.   Nuclear study September 2019 showed ejection fraction 62% and no ischemia.  Patient was scheduled to see electrophysiology at last office visit for consideration of ablation (Note I reviewed his previous tracing and this was felt to show a long RP tachycardia; there was note that it was treated with adenosine which demonstrated atrial flutter but strips were not available).  Patient recently admitted to [facility] with DKA and again had SVT versus atrial flutter by his report.  I do not have those electrocardiograms available.  Since last seen there is no chest pain.  He has mild dyspnea on exertion but no orthopnea or PND.  No palpitations or syncope.  Current Outpatient Medications  Medication Sig Dispense Refill  . BD PEN NEEDLE NANO U/F 32G X 4 MM MISC USE 1 TO INJECT INSULIN SUBCUTANEOUSLY TWICE DAILY  1  . Blood Glucose Monitoring Suppl (ONETOUCH VERIO) w/Device KIT     . diltiazem (CARDIZEM CD) 120 MG 24 hr capsule Take 120 mg by mouth daily.    . Dulaglutide (TRULICITY) 1.5 WC/3.7SE SOPN Inject 1.5 Units into the skin once a week.    Marland Kitchen EPINEPHrine 0.3 mg/0.3 mL IJ SOAJ injection See admin instructions.  1  . gabapentin (NEURONTIN) 300 MG capsule Take 300 mg by mouth 3 (three) times daily.  2  . Insulin Degludec (TRESIBA FLEXTOUCH) 200 UNIT/ML  SOPN Inject 50 Units into the skin daily.    . metFORMIN (GLUCOPHAGE) 1000 MG tablet Take 1,000 mg by mouth 2 (two) times daily with a meal.  3  . ONE TOUCH ULTRA TEST test strip USE 1 STRIP TO CHECK GLUCOSE THREE TIMES DAILY  1  . ONETOUCH DELICA LANCETS 83T MISC USE 1 LANCET TO CHECK GLUCOSE THREE TIMES DAILY  1  . rivaroxaban (XARELTO) 20 MG TABS tablet Take 20 mg by mouth daily.    . rosuvastatin (CRESTOR) 40 MG tablet Take 1 tablet (40 mg total) by mouth daily. 90 tablet 3  . sodium chloride (OCEAN) 0.65 % nasal spray Place 2 sprays into the nose daily.    Marland Kitchen umeclidinium-vilanterol (ANORO ELLIPTA) 62.5-25 MCG/INH AEPB Inhale 1 puff into the lungs daily.     No current facility-administered medications for this visit.      Past Medical History:  Diagnosis Date  . Atrial flutter ([place])   . DM (diabetes mellitus) ([place])   . Sleep apnea     Past Surgical History:  Procedure Laterality Date  . Arthroscopic knee surgery    . Arthroscopic shoulder surgery    . TONSILLECTOMY      Social History   Socioeconomic History  . Marital status: Married    Spouse name: Not on file  . Number of children: 3  . Years of education: Not on file  . Highest education level: Not on file  Occupational History  . Not on file  Social Needs  .  Financial resource strain: Not on file  . Food insecurity:    Worry: Not on file    Inability: Not on file  . Transportation needs:    Medical: Not on file    Non-medical: Not on file  Tobacco Use  . Smoking status: Current Every Day Smoker    Packs/day: 1.00    Years: 50.00    Pack years: 50.00    Types: Cigarettes  . Smokeless tobacco: Never Used  Substance and Sexual Activity  . Alcohol use: Not Currently  . Drug use: Not on file  . Sexual activity: Not on file  Lifestyle  . Physical activity:    Days per week: Not on file    Minutes per session: Not on file  . Stress: Not on file  Relationships  . Social connections:    Talks on phone:  Not on file    Gets together: Not on file    Attends religious service: Not on file    Active member of club or organization: Not on file    Attends meetings of clubs or organizations: Not on file    Relationship status: Not on file  . Intimate partner violence:    Fear of current or ex partner: Not on file    Emotionally abused: Not on file    Physically abused: Not on file    Forced sexual activity: Not on file  Other Topics Concern  . Not on file  Social History Narrative  . Not on file    Family History  Problem Relation Age of Onset  . Arthritis Mother   . Diabetes Father   . Arrhythmia Father     ROS: no fevers or chills, productive cough, hemoptysis, dysphasia, odynophagia, melena, hematochezia, dysuria, hematuria, rash, seizure activity, orthopnea, PND, pedal edema, claudication. Remaining systems are negative.  Physical Exam: Well-developed well-nourished in no acute distress.  Skin is warm and dry.  HEENT is normal.  Neck is supple.  Chest is clear to auscultation with normal expansion.  Cardiovascular exam is regular rate and rhythm.  Abdominal exam nontender or distended. No masses palpated. Extremities show 1+ ankle edema. neuro grossly intact  ECG-normal sinus rhythm at a rate of 80, no ST changes.  Personally reviewed  A/P  1 supraventricular tachycardia/atrial flutter-as outlined in previous notes when patient was seen at [facility] his initial electrocardiogram showed what appears to be a long RP tachycardia and heart rate of 182.  By records he was given adenosine with subsequent conversion to atrial flutter.  I do not have those electrocardiograms for review.  He was then given Cardizem and converted to sinus rhythm.  He was again recently admitted in December and he states he again had SVT versus atrial flutter.  Electrocardiograms are not available.  I will obtain those for review.  We will continue with Cardizem and Xarelto.  I have again asked him  to follow-up with electrophysiology.  Question if patient needs ablation of supraventricular tachycardia or atrial flutter.  Also question if anticoagulation needs to be continued if initial rhythm was SVT and atrial flutter occurred following adenosine.  2 coronary artery disease-patient denies chest pain.  He was found to have significant calcium on his previous CT scan but nuclear study showed no ischemia.  Continue medical therapy with statin.  No aspirin given need for anticoagulation.  3 hyperlipidemia-continue statin.  Check lipids and liver.  4 tobacco abuse-patient counseled on discontinuing.  5 bruit-schedule ultrasound to exclude aneurysm.  Addendum-electrocardiograms from [facility] obtained from December 2019.  They show possible atrial flutter.  As outlined above we will ask electrophysiology to review.  Kirk Ruths, MD

## 2018-01-04 DIAGNOSIS — R000 Tachycardia, unspecified: Secondary | ICD-10-CM | POA: Diagnosis not present

## 2018-01-04 DIAGNOSIS — R0600 Dyspnea, unspecified: Secondary | ICD-10-CM | POA: Diagnosis not present

## 2018-01-04 DIAGNOSIS — Z79899 Other long term (current) drug therapy: Secondary | ICD-10-CM | POA: Diagnosis not present

## 2018-01-04 DIAGNOSIS — R69 Illness, unspecified: Secondary | ICD-10-CM | POA: Diagnosis not present

## 2018-01-04 DIAGNOSIS — T50995A Adverse effect of other drugs, medicaments and biological substances, initial encounter: Secondary | ICD-10-CM | POA: Diagnosis not present

## 2018-01-04 DIAGNOSIS — R0602 Shortness of breath: Secondary | ICD-10-CM | POA: Diagnosis not present

## 2018-01-04 DIAGNOSIS — E0910 Drug or chemical induced diabetes mellitus with ketoacidosis without coma: Secondary | ICD-10-CM | POA: Diagnosis not present

## 2018-01-04 DIAGNOSIS — Z7901 Long term (current) use of anticoagulants: Secondary | ICD-10-CM | POA: Diagnosis not present

## 2018-01-04 DIAGNOSIS — E119 Type 2 diabetes mellitus without complications: Secondary | ICD-10-CM | POA: Diagnosis not present

## 2018-01-04 DIAGNOSIS — E1110 Type 2 diabetes mellitus with ketoacidosis without coma: Secondary | ICD-10-CM | POA: Diagnosis not present

## 2018-01-04 DIAGNOSIS — Z888 Allergy status to other drugs, medicaments and biological substances status: Secondary | ICD-10-CM | POA: Diagnosis not present

## 2018-01-04 DIAGNOSIS — E872 Acidosis: Secondary | ICD-10-CM | POA: Diagnosis not present

## 2018-01-04 DIAGNOSIS — Z794 Long term (current) use of insulin: Secondary | ICD-10-CM | POA: Diagnosis not present

## 2018-01-04 DIAGNOSIS — R112 Nausea with vomiting, unspecified: Secondary | ICD-10-CM | POA: Diagnosis not present

## 2018-01-04 DIAGNOSIS — J449 Chronic obstructive pulmonary disease, unspecified: Secondary | ICD-10-CM | POA: Diagnosis not present

## 2018-01-04 DIAGNOSIS — G4733 Obstructive sleep apnea (adult) (pediatric): Secondary | ICD-10-CM | POA: Diagnosis not present

## 2018-01-04 DIAGNOSIS — I4892 Unspecified atrial flutter: Secondary | ICD-10-CM | POA: Diagnosis not present

## 2018-01-04 DIAGNOSIS — R9431 Abnormal electrocardiogram [ECG] [EKG]: Secondary | ICD-10-CM | POA: Diagnosis not present

## 2018-01-04 DIAGNOSIS — E785 Hyperlipidemia, unspecified: Secondary | ICD-10-CM | POA: Diagnosis not present

## 2018-01-04 DIAGNOSIS — Z91018 Allergy to other foods: Secondary | ICD-10-CM | POA: Diagnosis not present

## 2018-01-12 ENCOUNTER — Ambulatory Visit (INDEPENDENT_AMBULATORY_CARE_PROVIDER_SITE_OTHER): Payer: Medicare HMO | Admitting: Cardiology

## 2018-01-12 ENCOUNTER — Encounter: Payer: Self-pay | Admitting: Cardiology

## 2018-01-12 VITALS — BP 119/68 | HR 84 | Ht 71.0 in | Wt 184.0 lb

## 2018-01-12 DIAGNOSIS — E7800 Pure hypercholesterolemia, unspecified: Secondary | ICD-10-CM | POA: Diagnosis not present

## 2018-01-12 DIAGNOSIS — I25119 Atherosclerotic heart disease of native coronary artery with unspecified angina pectoris: Secondary | ICD-10-CM

## 2018-01-12 DIAGNOSIS — R0989 Other specified symptoms and signs involving the circulatory and respiratory systems: Secondary | ICD-10-CM | POA: Diagnosis not present

## 2018-01-12 DIAGNOSIS — I4892 Unspecified atrial flutter: Secondary | ICD-10-CM

## 2018-01-12 DIAGNOSIS — Z720 Tobacco use: Secondary | ICD-10-CM

## 2018-01-12 NOTE — Patient Instructions (Signed)
Medication Instructions:  NO CHANGE If you need a refill on your cardiac medications before your next appointment, please call your pharmacy.   Lab work: Your physician recommends that you return for lab work PRIOR TO EATING If you have labs (blood work) drawn today and your tests are completely normal, you will receive your results only by: Marland Kitchen. MyChart Message (if you have MyChart) OR . A paper copy in the mail If you have any lab test that is abnormal or we need to change your treatment, we will call you to review the results.  Testing/Procedures: Your physician has requested that you have an abdominal aorta duplex. During this test, an ultrasound is used to evaluate the aorta. Allow 30 minutes for this exam. Do not eat after midnight the day before and avoid carbonated beverages AT THE [place] OFFICE-IMAGING DEPARTMENT ON THE FIRST FLOOR  Follow-Up: Your physician recommends that you schedule a follow-up appointment in: 6 MONTHS WITH DR Jens SomRENSHAW [facility]POINTMENT IN June 2020  REFERRAL TO EP FOR ATRIAL FLUTTER

## 2018-01-13 DIAGNOSIS — J309 Allergic rhinitis, unspecified: Secondary | ICD-10-CM | POA: Diagnosis not present

## 2018-01-13 DIAGNOSIS — I25119 Atherosclerotic heart disease of native coronary artery with unspecified angina pectoris: Secondary | ICD-10-CM | POA: Diagnosis not present

## 2018-01-14 ENCOUNTER — Encounter: Payer: Self-pay | Admitting: *Deleted

## 2018-01-14 ENCOUNTER — Telehealth: Payer: Self-pay | Admitting: Cardiology

## 2018-01-14 LAB — HEPATIC FUNCTION PANEL
AG Ratio: 2 (calc) (ref 1.0–2.5)
ALT: 10 U/L (ref 9–46)
AST: 9 U/L — ABNORMAL LOW (ref 10–35)
Albumin: 4.2 g/dL (ref 3.6–5.1)
Alkaline phosphatase (APISO): 104 U/L (ref 40–115)
Bilirubin, Direct: 0.1 mg/dL (ref 0.0–0.2)
Globulin: 2.1 g/dL (calc) (ref 1.9–3.7)
Indirect Bilirubin: 0.4 mg/dL (calc) (ref 0.2–1.2)
Total Bilirubin: 0.5 mg/dL (ref 0.2–1.2)
Total Protein: 6.3 g/dL (ref 6.1–8.1)

## 2018-01-14 LAB — LIPID PANEL
Cholesterol: 96 mg/dL (ref <200)
HDL: 49 mg/dL (ref >40)
LDL Cholesterol (Calc): 33 mg/dL (calc)
Non-HDL Cholesterol (Calc): 47 mg/dL (calc) (ref <130)
Total CHOL/HDL Ratio: 2 (calc) (ref <5.0)
Triglycerides: 55 mg/dL (ref <150)

## 2018-01-14 NOTE — Telephone Encounter (Signed)
[facility] 01/14/18 vlm

## 2018-01-17 DIAGNOSIS — J309 Allergic rhinitis, unspecified: Secondary | ICD-10-CM | POA: Diagnosis not present

## 2018-01-21 DIAGNOSIS — E1165 Type 2 diabetes mellitus with hyperglycemia: Secondary | ICD-10-CM | POA: Diagnosis not present

## 2018-01-21 DIAGNOSIS — I10 Essential (primary) hypertension: Secondary | ICD-10-CM | POA: Diagnosis not present

## 2018-01-21 DIAGNOSIS — I2510 Atherosclerotic heart disease of native coronary artery without angina pectoris: Secondary | ICD-10-CM | POA: Diagnosis not present

## 2018-01-21 DIAGNOSIS — G4730 Sleep apnea, unspecified: Secondary | ICD-10-CM | POA: Diagnosis not present

## 2018-01-21 DIAGNOSIS — I4891 Unspecified atrial fibrillation: Secondary | ICD-10-CM | POA: Diagnosis not present

## 2018-01-22 DIAGNOSIS — I444 Left anterior fascicular block: Secondary | ICD-10-CM | POA: Diagnosis not present

## 2018-01-22 DIAGNOSIS — R5383 Other fatigue: Secondary | ICD-10-CM | POA: Diagnosis not present

## 2018-01-22 DIAGNOSIS — Z8679 Personal history of other diseases of the circulatory system: Secondary | ICD-10-CM | POA: Diagnosis not present

## 2018-01-22 DIAGNOSIS — I4892 Unspecified atrial flutter: Secondary | ICD-10-CM | POA: Diagnosis not present

## 2018-01-22 DIAGNOSIS — Z7901 Long term (current) use of anticoagulants: Secondary | ICD-10-CM | POA: Diagnosis not present

## 2018-01-22 DIAGNOSIS — R0689 Other abnormalities of breathing: Secondary | ICD-10-CM | POA: Diagnosis not present

## 2018-01-22 DIAGNOSIS — I483 Typical atrial flutter: Secondary | ICD-10-CM | POA: Diagnosis not present

## 2018-01-22 DIAGNOSIS — R9431 Abnormal electrocardiogram [ECG] [EKG]: Secondary | ICD-10-CM | POA: Diagnosis not present

## 2018-01-22 DIAGNOSIS — I4589 Other specified conduction disorders: Secondary | ICD-10-CM | POA: Diagnosis not present

## 2018-01-22 DIAGNOSIS — G4733 Obstructive sleep apnea (adult) (pediatric): Secondary | ICD-10-CM | POA: Diagnosis not present

## 2018-01-22 DIAGNOSIS — E119 Type 2 diabetes mellitus without complications: Secondary | ICD-10-CM | POA: Diagnosis not present

## 2018-01-22 DIAGNOSIS — R000 Tachycardia, unspecified: Secondary | ICD-10-CM | POA: Diagnosis not present

## 2018-01-22 DIAGNOSIS — E785 Hyperlipidemia, unspecified: Secondary | ICD-10-CM | POA: Diagnosis not present

## 2018-01-22 DIAGNOSIS — Z7984 Long term (current) use of oral hypoglycemic drugs: Secondary | ICD-10-CM | POA: Diagnosis not present

## 2018-01-23 DIAGNOSIS — R000 Tachycardia, unspecified: Secondary | ICD-10-CM | POA: Diagnosis not present

## 2018-01-23 DIAGNOSIS — I4589 Other specified conduction disorders: Secondary | ICD-10-CM | POA: Diagnosis not present

## 2018-01-23 DIAGNOSIS — I444 Left anterior fascicular block: Secondary | ICD-10-CM | POA: Diagnosis not present

## 2018-01-23 DIAGNOSIS — R9431 Abnormal electrocardiogram [ECG] [EKG]: Secondary | ICD-10-CM | POA: Diagnosis not present

## 2018-01-23 DIAGNOSIS — I4892 Unspecified atrial flutter: Secondary | ICD-10-CM | POA: Diagnosis not present

## 2018-01-24 DIAGNOSIS — I483 Typical atrial flutter: Secondary | ICD-10-CM | POA: Diagnosis not present

## 2018-01-25 DIAGNOSIS — Z8679 Personal history of other diseases of the circulatory system: Secondary | ICD-10-CM | POA: Diagnosis not present

## 2018-01-25 DIAGNOSIS — R000 Tachycardia, unspecified: Secondary | ICD-10-CM | POA: Diagnosis not present

## 2018-01-25 DIAGNOSIS — I4892 Unspecified atrial flutter: Secondary | ICD-10-CM | POA: Diagnosis not present

## 2018-01-27 ENCOUNTER — Telehealth: Payer: Self-pay | Admitting: Cardiology

## 2018-01-27 NOTE — Telephone Encounter (Signed)
New message    Pt canceled appt with Dr. Elberta Fortis because he already had procedure .

## 2018-01-27 NOTE — Telephone Encounter (Signed)
Update message fwd to Dr.Crenshaw.

## 2018-01-28 ENCOUNTER — Ambulatory Visit (HOSPITAL_BASED_OUTPATIENT_CLINIC_OR_DEPARTMENT_OTHER): Payer: Medicare HMO

## 2018-01-31 DIAGNOSIS — I4892 Unspecified atrial flutter: Secondary | ICD-10-CM | POA: Diagnosis not present

## 2018-01-31 DIAGNOSIS — I4589 Other specified conduction disorders: Secondary | ICD-10-CM | POA: Diagnosis not present

## 2018-01-31 DIAGNOSIS — R9431 Abnormal electrocardiogram [ECG] [EKG]: Secondary | ICD-10-CM | POA: Diagnosis not present

## 2018-02-07 ENCOUNTER — Telehealth: Payer: Self-pay | Admitting: Cardiology

## 2018-02-07 ENCOUNTER — Institutional Professional Consult (permissible substitution): Payer: Medicare HMO | Admitting: Cardiology

## 2018-02-07 NOTE — Telephone Encounter (Signed)
° ° °  Patient states he is being followed by EP doctor at [facility], where he had ablation.  Patient wants to know if Dr Jens Som wants him to still have Aorta scan. Please advise

## 2018-02-07 NOTE — Telephone Encounter (Addendum)
Spoke with pt, he had a CT scan 06/2017 and there was no evidence of aortic aneurysm. No scan needed per dr Jens Som.

## 2018-02-14 DIAGNOSIS — E1165 Type 2 diabetes mellitus with hyperglycemia: Secondary | ICD-10-CM | POA: Diagnosis not present

## 2018-02-14 DIAGNOSIS — J309 Allergic rhinitis, unspecified: Secondary | ICD-10-CM | POA: Diagnosis not present

## 2018-02-21 DIAGNOSIS — J309 Allergic rhinitis, unspecified: Secondary | ICD-10-CM | POA: Diagnosis not present

## 2018-02-23 DIAGNOSIS — I4891 Unspecified atrial fibrillation: Secondary | ICD-10-CM | POA: Diagnosis not present

## 2018-02-23 DIAGNOSIS — G4730 Sleep apnea, unspecified: Secondary | ICD-10-CM | POA: Diagnosis not present

## 2018-02-23 DIAGNOSIS — M48062 Spinal stenosis, lumbar region with neurogenic claudication: Secondary | ICD-10-CM | POA: Diagnosis not present

## 2018-02-23 DIAGNOSIS — E1165 Type 2 diabetes mellitus with hyperglycemia: Secondary | ICD-10-CM | POA: Diagnosis not present

## 2018-02-23 DIAGNOSIS — I2510 Atherosclerotic heart disease of native coronary artery without angina pectoris: Secondary | ICD-10-CM | POA: Diagnosis not present

## 2018-02-23 DIAGNOSIS — I10 Essential (primary) hypertension: Secondary | ICD-10-CM | POA: Diagnosis not present

## 2018-02-23 DIAGNOSIS — M533 Sacrococcygeal disorders, not elsewhere classified: Secondary | ICD-10-CM | POA: Diagnosis not present

## 2018-02-23 DIAGNOSIS — M47812 Spondylosis without myelopathy or radiculopathy, cervical region: Secondary | ICD-10-CM | POA: Diagnosis not present

## 2018-02-23 DIAGNOSIS — Z720 Tobacco use: Secondary | ICD-10-CM | POA: Diagnosis not present

## 2018-03-07 DIAGNOSIS — R69 Illness, unspecified: Secondary | ICD-10-CM | POA: Diagnosis not present

## 2018-03-08 DIAGNOSIS — R69 Illness, unspecified: Secondary | ICD-10-CM | POA: Diagnosis not present

## 2018-03-09 DIAGNOSIS — J309 Allergic rhinitis, unspecified: Secondary | ICD-10-CM | POA: Diagnosis not present

## 2018-03-16 DIAGNOSIS — J309 Allergic rhinitis, unspecified: Secondary | ICD-10-CM | POA: Diagnosis not present

## 2018-04-14 DIAGNOSIS — J309 Allergic rhinitis, unspecified: Secondary | ICD-10-CM | POA: Diagnosis not present

## 2018-04-29 DIAGNOSIS — I493 Ventricular premature depolarization: Secondary | ICD-10-CM | POA: Diagnosis not present

## 2018-04-29 DIAGNOSIS — I4892 Unspecified atrial flutter: Secondary | ICD-10-CM | POA: Diagnosis not present

## 2018-04-29 DIAGNOSIS — I480 Paroxysmal atrial fibrillation: Secondary | ICD-10-CM | POA: Diagnosis not present

## 2018-04-29 DIAGNOSIS — Z4509 Encounter for adjustment and management of other cardiac device: Secondary | ICD-10-CM | POA: Diagnosis not present

## 2018-04-29 DIAGNOSIS — Z95818 Presence of other cardiac implants and grafts: Secondary | ICD-10-CM | POA: Diagnosis not present

## 2018-05-23 DIAGNOSIS — I10 Essential (primary) hypertension: Secondary | ICD-10-CM | POA: Diagnosis not present

## 2018-05-23 DIAGNOSIS — R6889 Other general symptoms and signs: Secondary | ICD-10-CM | POA: Diagnosis not present

## 2018-05-23 DIAGNOSIS — R05 Cough: Secondary | ICD-10-CM | POA: Diagnosis not present

## 2018-05-23 DIAGNOSIS — E1165 Type 2 diabetes mellitus with hyperglycemia: Secondary | ICD-10-CM | POA: Diagnosis not present

## 2018-05-26 DIAGNOSIS — Z8619 Personal history of other infectious and parasitic diseases: Secondary | ICD-10-CM | POA: Diagnosis not present

## 2018-05-30 DIAGNOSIS — R69 Illness, unspecified: Secondary | ICD-10-CM | POA: Diagnosis not present

## 2018-06-14 DIAGNOSIS — J309 Allergic rhinitis, unspecified: Secondary | ICD-10-CM | POA: Diagnosis not present

## 2018-06-21 DIAGNOSIS — J309 Allergic rhinitis, unspecified: Secondary | ICD-10-CM | POA: Diagnosis not present

## 2018-07-06 DIAGNOSIS — J309 Allergic rhinitis, unspecified: Secondary | ICD-10-CM | POA: Diagnosis not present

## 2018-08-01 DIAGNOSIS — Z4509 Encounter for adjustment and management of other cardiac device: Secondary | ICD-10-CM | POA: Diagnosis not present

## 2018-08-01 DIAGNOSIS — I4892 Unspecified atrial flutter: Secondary | ICD-10-CM | POA: Diagnosis not present

## 2018-08-02 ENCOUNTER — Telehealth: Payer: Self-pay | Admitting: *Deleted

## 2018-08-02 NOTE — Telephone Encounter (Signed)
A message was left, re: follow up visit. 

## 2018-09-06 ENCOUNTER — Telehealth: Payer: Self-pay | Admitting: *Deleted

## 2018-09-06 NOTE — Telephone Encounter (Signed)
Unable to reach by phone, no signal.

## 2018-12-19 ENCOUNTER — Other Ambulatory Visit: Payer: Self-pay | Admitting: Cardiology

## 2018-12-19 NOTE — Progress Notes (Deleted)
HPI: FU coronary artery disease/coronary calcification noted on CT scan. Echocardiogram at [facility] June 2019 showed normal LV function and mild left ventricular hypertrophy. Chest CT performed for lung cancer screening on September 29, 2017 showed three-vessel coronary atherosclerosis. Pt admitted to [facility] 6/19 with atrial flutter (found incidentally on PE; no symptoms).  I reviewed this electrocardiogram and it appeared to show a long RP tachycardia.  Patient given adenosine and then by report developed atrial flutter (strips not available).  Pt converted following initiation of cardizem. Pt placed on xarelto and DCed. TSH 0.963.  Nuclear study September 2019 showed ejection fraction 62% and no ischemia.  Patient was scheduled to see electrophysiology at last office visit for consideration of ablation.  Patient previouisly admitted to [facility] with DKA and again had SVT versus atrial flutter by his report.  I do not have those electrocardiograms available.  Since last seen   Current Outpatient Medications  Medication Sig Dispense Refill   BD PEN NEEDLE NANO U/F 32G X 4 MM MISC USE 1 TO INJECT INSULIN SUBCUTANEOUSLY TWICE DAILY  1   Blood Glucose Monitoring Suppl (ONETOUCH VERIO) w/Device KIT      diltiazem (CARDIZEM CD) 120 MG 24 hr capsule Take 120 mg by mouth daily.     Dulaglutide (TRULICITY) 1.5 FH/5.4TG SOPN Inject 1.5 Units into the skin once a week.     EPINEPHrine 0.3 mg/0.3 mL IJ SOAJ injection See admin instructions.  1   gabapentin (NEURONTIN) 300 MG capsule Take 300 mg by mouth 3 (three) times daily.  2   Insulin Degludec (TRESIBA FLEXTOUCH) 200 UNIT/ML SOPN Inject 50 Units into the skin daily.     metFORMIN (GLUCOPHAGE) 1000 MG tablet Take 1,000 mg by mouth 2 (two) times daily with a meal.  3   ONE TOUCH ULTRA TEST test strip USE 1 STRIP TO CHECK GLUCOSE THREE TIMES DAILY  1   ONETOUCH DELICA LANCETS 25W MISC USE 1 LANCET TO CHECK GLUCOSE THREE  TIMES DAILY  1   rivaroxaban (XARELTO) 20 MG TABS tablet Take 20 mg by mouth daily.     rosuvastatin (CRESTOR) 40 MG tablet Take 1 tablet (40 mg total) by mouth daily. 90 tablet 3   sodium chloride (OCEAN) 0.65 % nasal spray Place 2 sprays into the nose daily.     umeclidinium-vilanterol (ANORO ELLIPTA) 62.5-25 MCG/INH AEPB Inhale 1 puff into the lungs daily.     No current facility-administered medications for this visit.      Past Medical History:  Diagnosis Date   Atrial flutter ([place])    DM (diabetes mellitus) ([place])    Sleep apnea     Past Surgical History:  Procedure Laterality Date   Arthroscopic knee surgery     Arthroscopic shoulder surgery     TONSILLECTOMY      Social History   Socioeconomic History   Marital status: Married    Spouse name: Not on file   Number of children: 3   Years of education: Not on file   Highest education level: Not on file  Occupational History   Not on file  Social Needs   Financial resource strain: Not on file   Food insecurity    Worry: Not on file    Inability: Not on file   Transportation needs    Medical: Not on file    Non-medical: Not on file  Tobacco Use   Smoking status: Current Every Day Smoker    Packs/day:  1.00    Years: 50.00    Pack years: 50.00    Types: Cigarettes   Smokeless tobacco: Never Used  Substance and Sexual Activity   Alcohol use: Not Currently   Drug use: Not on file   Sexual activity: Not on file  Lifestyle   Physical activity    Days per week: Not on file    Minutes per session: Not on file   Stress: Not on file  Relationships   Social connections    Talks on phone: Not on file    Gets together: Not on file    Attends religious service: Not on file    Active member of club or organization: Not on file    Attends meetings of clubs or organizations: Not on file    Relationship status: Not on file   Intimate partner violence    Fear of current or ex partner: Not  on file    Emotionally abused: Not on file    Physically abused: Not on file    Forced sexual activity: Not on file  Other Topics Concern   Not on file  Social History Narrative   Not on file    Family History  Problem Relation Age of Onset   Arthritis Mother    Diabetes Father    Arrhythmia Father     ROS: no fevers or chills, productive cough, hemoptysis, dysphasia, odynophagia, melena, hematochezia, dysuria, hematuria, rash, seizure activity, orthopnea, PND, pedal edema, claudication. Remaining systems are negative.  Physical Exam: Well-developed well-nourished in no acute distress.  Skin is warm and dry.  HEENT is normal.  Neck is supple.  Chest is clear to auscultation with normal expansion.  Cardiovascular exam is regular rate and rhythm.  Abdominal exam nontender or distended. No masses palpated. Extremities show no edema. neuro grossly intact  ECG- personally reviewed  A/P  1 supraventricular tachycardia/atrial flutter-I previously reviewed the patient's electrocardiogram and felt it was likely a long RP tachycardia.  By report patient was given adenosine and converted to atrial flutter but those electrocardiograms were not available for review.  Patient was then given Cardizem and converted to sinus rhythm.  I was then able to obtain follow-up electrocardiogram from [facility] in this ECG showed atrial flutter.  We will continue with Cardizem and Xarelto.  I previously asked him to follow-up with electrophysiology for consideration of ablation.  2 coronary artery disease-patient has not had chest pain but noted to have calcium on previous CT scan.  Previous nuclear study showed no ischemia.  Continue statin.  No aspirin given need for Xarelto.  3 tobacco abuse-patient counseled on discontinuing.  4 hyperlipidemia-continue statin.  5 abdominal bruit-we previously scheduled an abdominal ultrasound.  I will rearrange.  Kirk Ruths, MD

## 2018-12-27 ENCOUNTER — Ambulatory Visit: Payer: Medicare HMO | Admitting: Cardiology

## 2019-01-31 DIAGNOSIS — Z4509 Encounter for adjustment and management of other cardiac device: Secondary | ICD-10-CM | POA: Diagnosis not present

## 2019-01-31 DIAGNOSIS — I4892 Unspecified atrial flutter: Secondary | ICD-10-CM | POA: Diagnosis not present

## 2019-03-10 ENCOUNTER — Other Ambulatory Visit: Payer: Self-pay | Admitting: Cardiology

## 2019-05-01 DIAGNOSIS — I4892 Unspecified atrial flutter: Secondary | ICD-10-CM | POA: Diagnosis not present

## 2019-05-01 DIAGNOSIS — I471 Supraventricular tachycardia: Secondary | ICD-10-CM | POA: Diagnosis not present

## 2019-05-01 DIAGNOSIS — Z4509 Encounter for adjustment and management of other cardiac device: Secondary | ICD-10-CM | POA: Diagnosis not present

## 2019-05-01 DIAGNOSIS — Z95818 Presence of other cardiac implants and grafts: Secondary | ICD-10-CM | POA: Diagnosis not present

## 2019-05-04 DIAGNOSIS — Z4509 Encounter for adjustment and management of other cardiac device: Secondary | ICD-10-CM | POA: Diagnosis not present

## 2019-05-04 DIAGNOSIS — I4892 Unspecified atrial flutter: Secondary | ICD-10-CM | POA: Diagnosis not present

## 2019-08-01 DIAGNOSIS — Z4509 Encounter for adjustment and management of other cardiac device: Secondary | ICD-10-CM | POA: Diagnosis not present

## 2019-08-01 DIAGNOSIS — I4892 Unspecified atrial flutter: Secondary | ICD-10-CM | POA: Diagnosis not present

## 2019-09-01 DIAGNOSIS — Z4509 Encounter for adjustment and management of other cardiac device: Secondary | ICD-10-CM | POA: Diagnosis not present

## 2019-09-01 DIAGNOSIS — I4892 Unspecified atrial flutter: Secondary | ICD-10-CM | POA: Diagnosis not present

## 2019-10-01 DIAGNOSIS — Z4509 Encounter for adjustment and management of other cardiac device: Secondary | ICD-10-CM | POA: Diagnosis not present

## 2019-10-01 DIAGNOSIS — I4892 Unspecified atrial flutter: Secondary | ICD-10-CM | POA: Diagnosis not present

## 2019-10-02 DIAGNOSIS — E872 Acidosis: Secondary | ICD-10-CM | POA: Diagnosis not present

## 2019-10-02 DIAGNOSIS — R1013 Epigastric pain: Secondary | ICD-10-CM | POA: Diagnosis not present

## 2019-10-02 DIAGNOSIS — E1165 Type 2 diabetes mellitus with hyperglycemia: Secondary | ICD-10-CM | POA: Diagnosis not present

## 2019-10-02 DIAGNOSIS — J9601 Acute respiratory failure with hypoxia: Secondary | ICD-10-CM | POA: Diagnosis not present

## 2019-10-02 DIAGNOSIS — M47812 Spondylosis without myelopathy or radiculopathy, cervical region: Secondary | ICD-10-CM | POA: Diagnosis not present

## 2019-10-02 DIAGNOSIS — E1110 Type 2 diabetes mellitus with ketoacidosis without coma: Secondary | ICD-10-CM | POA: Diagnosis not present

## 2019-10-02 DIAGNOSIS — Z20822 Contact with and (suspected) exposure to covid-19: Secondary | ICD-10-CM | POA: Diagnosis not present

## 2019-10-03 DIAGNOSIS — Z7901 Long term (current) use of anticoagulants: Secondary | ICD-10-CM | POA: Diagnosis not present

## 2019-10-03 DIAGNOSIS — I4892 Unspecified atrial flutter: Secondary | ICD-10-CM | POA: Diagnosis not present

## 2019-10-03 DIAGNOSIS — I491 Atrial premature depolarization: Secondary | ICD-10-CM | POA: Diagnosis not present

## 2019-10-03 DIAGNOSIS — Z950 Presence of cardiac pacemaker: Secondary | ICD-10-CM | POA: Diagnosis not present

## 2019-10-03 DIAGNOSIS — E785 Hyperlipidemia, unspecified: Secondary | ICD-10-CM | POA: Diagnosis not present

## 2019-10-03 DIAGNOSIS — R05 Cough: Secondary | ICD-10-CM | POA: Diagnosis not present

## 2019-10-03 DIAGNOSIS — Z794 Long term (current) use of insulin: Secondary | ICD-10-CM | POA: Diagnosis not present

## 2019-10-03 DIAGNOSIS — E1110 Type 2 diabetes mellitus with ketoacidosis without coma: Secondary | ICD-10-CM | POA: Diagnosis not present

## 2019-10-04 DIAGNOSIS — Z794 Long term (current) use of insulin: Secondary | ICD-10-CM | POA: Diagnosis not present

## 2019-10-04 DIAGNOSIS — E1010 Type 1 diabetes mellitus with ketoacidosis without coma: Secondary | ICD-10-CM | POA: Diagnosis not present

## 2019-10-04 DIAGNOSIS — I4892 Unspecified atrial flutter: Secondary | ICD-10-CM | POA: Diagnosis not present

## 2019-10-04 DIAGNOSIS — R109 Unspecified abdominal pain: Secondary | ICD-10-CM | POA: Diagnosis not present

## 2019-10-04 DIAGNOSIS — J9601 Acute respiratory failure with hypoxia: Secondary | ICD-10-CM | POA: Diagnosis not present

## 2019-10-04 DIAGNOSIS — Z950 Presence of cardiac pacemaker: Secondary | ICD-10-CM | POA: Diagnosis not present

## 2019-10-04 DIAGNOSIS — R1084 Generalized abdominal pain: Secondary | ICD-10-CM | POA: Diagnosis not present

## 2019-10-04 DIAGNOSIS — E1165 Type 2 diabetes mellitus with hyperglycemia: Secondary | ICD-10-CM | POA: Diagnosis not present

## 2019-10-04 DIAGNOSIS — R739 Hyperglycemia, unspecified: Secondary | ICD-10-CM | POA: Diagnosis not present

## 2019-10-04 DIAGNOSIS — R1013 Epigastric pain: Secondary | ICD-10-CM | POA: Diagnosis not present

## 2019-10-04 DIAGNOSIS — D72829 Elevated white blood cell count, unspecified: Secondary | ICD-10-CM | POA: Diagnosis not present

## 2019-10-04 DIAGNOSIS — E872 Acidosis: Secondary | ICD-10-CM | POA: Diagnosis not present

## 2019-10-05 DIAGNOSIS — K2980 Duodenitis without bleeding: Secondary | ICD-10-CM | POA: Diagnosis not present

## 2019-10-05 DIAGNOSIS — Z720 Tobacco use: Secondary | ICD-10-CM | POA: Diagnosis not present

## 2019-10-05 DIAGNOSIS — J9611 Chronic respiratory failure with hypoxia: Secondary | ICD-10-CM | POA: Diagnosis not present

## 2019-10-05 DIAGNOSIS — E1010 Type 1 diabetes mellitus with ketoacidosis without coma: Secondary | ICD-10-CM | POA: Diagnosis not present

## 2019-10-05 DIAGNOSIS — J181 Lobar pneumonia, unspecified organism: Secondary | ICD-10-CM | POA: Diagnosis not present

## 2019-10-05 DIAGNOSIS — G4733 Obstructive sleep apnea (adult) (pediatric): Secondary | ICD-10-CM | POA: Diagnosis not present

## 2019-10-05 DIAGNOSIS — A040 Enteropathogenic Escherichia coli infection: Secondary | ICD-10-CM | POA: Diagnosis not present

## 2019-10-05 DIAGNOSIS — J9601 Acute respiratory failure with hypoxia: Secondary | ICD-10-CM | POA: Diagnosis not present

## 2019-10-05 DIAGNOSIS — I4892 Unspecified atrial flutter: Secondary | ICD-10-CM | POA: Diagnosis not present

## 2019-10-05 DIAGNOSIS — E878 Other disorders of electrolyte and fluid balance, not elsewhere classified: Secondary | ICD-10-CM | POA: Diagnosis not present

## 2019-10-05 DIAGNOSIS — R69 Illness, unspecified: Secondary | ICD-10-CM | POA: Diagnosis not present

## 2019-10-05 DIAGNOSIS — R935 Abnormal findings on diagnostic imaging of other abdominal regions, including retroperitoneum: Secondary | ICD-10-CM | POA: Diagnosis not present

## 2019-10-05 DIAGNOSIS — J188 Other pneumonia, unspecified organism: Secondary | ICD-10-CM | POA: Diagnosis not present

## 2019-10-05 DIAGNOSIS — R112 Nausea with vomiting, unspecified: Secondary | ICD-10-CM | POA: Diagnosis not present

## 2019-10-05 DIAGNOSIS — R1013 Epigastric pain: Secondary | ICD-10-CM | POA: Diagnosis not present

## 2019-10-05 DIAGNOSIS — Z66 Do not resuscitate: Secondary | ICD-10-CM | POA: Diagnosis not present

## 2019-10-05 DIAGNOSIS — J159 Unspecified bacterial pneumonia: Secondary | ICD-10-CM | POA: Diagnosis not present

## 2019-10-05 DIAGNOSIS — Z20822 Contact with and (suspected) exposure to covid-19: Secondary | ICD-10-CM | POA: Diagnosis not present

## 2019-10-05 DIAGNOSIS — E1110 Type 2 diabetes mellitus with ketoacidosis without coma: Secondary | ICD-10-CM | POA: Diagnosis not present

## 2019-10-05 DIAGNOSIS — E119 Type 2 diabetes mellitus without complications: Secondary | ICD-10-CM | POA: Diagnosis not present

## 2019-10-05 DIAGNOSIS — J189 Pneumonia, unspecified organism: Secondary | ICD-10-CM | POA: Diagnosis not present

## 2019-10-05 DIAGNOSIS — E876 Hypokalemia: Secondary | ICD-10-CM | POA: Diagnosis not present

## 2019-10-05 DIAGNOSIS — R109 Unspecified abdominal pain: Secondary | ICD-10-CM | POA: Diagnosis not present

## 2019-10-05 DIAGNOSIS — R188 Other ascites: Secondary | ICD-10-CM | POA: Diagnosis not present

## 2019-10-05 DIAGNOSIS — R918 Other nonspecific abnormal finding of lung field: Secondary | ICD-10-CM | POA: Diagnosis not present

## 2019-10-05 DIAGNOSIS — E1169 Type 2 diabetes mellitus with other specified complication: Secondary | ICD-10-CM | POA: Diagnosis not present

## 2019-10-05 DIAGNOSIS — Z794 Long term (current) use of insulin: Secondary | ICD-10-CM | POA: Diagnosis not present

## 2019-10-05 DIAGNOSIS — J90 Pleural effusion, not elsewhere classified: Secondary | ICD-10-CM | POA: Diagnosis not present

## 2019-10-05 DIAGNOSIS — I482 Chronic atrial fibrillation, unspecified: Secondary | ICD-10-CM | POA: Diagnosis not present

## 2019-10-05 DIAGNOSIS — Z9981 Dependence on supplemental oxygen: Secondary | ICD-10-CM | POA: Diagnosis not present

## 2019-10-05 DIAGNOSIS — Z7901 Long term (current) use of anticoagulants: Secondary | ICD-10-CM | POA: Diagnosis not present

## 2019-10-15 DIAGNOSIS — D500 Iron deficiency anemia secondary to blood loss (chronic): Secondary | ICD-10-CM | POA: Diagnosis not present

## 2019-10-15 DIAGNOSIS — E785 Hyperlipidemia, unspecified: Secondary | ICD-10-CM | POA: Diagnosis not present

## 2019-10-15 DIAGNOSIS — R52 Pain, unspecified: Secondary | ICD-10-CM | POA: Diagnosis not present

## 2019-10-15 DIAGNOSIS — I4892 Unspecified atrial flutter: Secondary | ICD-10-CM | POA: Diagnosis not present

## 2019-10-15 DIAGNOSIS — E1165 Type 2 diabetes mellitus with hyperglycemia: Secondary | ICD-10-CM | POA: Diagnosis not present

## 2019-10-15 DIAGNOSIS — K922 Gastrointestinal hemorrhage, unspecified: Secondary | ICD-10-CM | POA: Diagnosis not present

## 2019-10-15 DIAGNOSIS — I4891 Unspecified atrial fibrillation: Secondary | ICD-10-CM | POA: Diagnosis not present

## 2019-10-15 DIAGNOSIS — K264 Chronic or unspecified duodenal ulcer with hemorrhage: Secondary | ICD-10-CM | POA: Diagnosis not present

## 2019-10-15 DIAGNOSIS — R069 Unspecified abnormalities of breathing: Secondary | ICD-10-CM | POA: Diagnosis not present

## 2019-10-15 DIAGNOSIS — E119 Type 2 diabetes mellitus without complications: Secondary | ICD-10-CM | POA: Diagnosis not present

## 2019-10-15 DIAGNOSIS — K209 Esophagitis, unspecified without bleeding: Secondary | ICD-10-CM | POA: Diagnosis not present

## 2019-10-15 DIAGNOSIS — Z794 Long term (current) use of insulin: Secondary | ICD-10-CM | POA: Diagnosis not present

## 2019-10-15 DIAGNOSIS — K2091 Esophagitis, unspecified with bleeding: Secondary | ICD-10-CM | POA: Diagnosis not present

## 2019-10-15 DIAGNOSIS — M5489 Other dorsalgia: Secondary | ICD-10-CM | POA: Diagnosis not present

## 2019-10-15 DIAGNOSIS — R0602 Shortness of breath: Secondary | ICD-10-CM | POA: Diagnosis not present

## 2019-10-16 DIAGNOSIS — E119 Type 2 diabetes mellitus without complications: Secondary | ICD-10-CM | POA: Diagnosis not present

## 2019-10-16 DIAGNOSIS — Z7901 Long term (current) use of anticoagulants: Secondary | ICD-10-CM | POA: Diagnosis not present

## 2019-10-16 DIAGNOSIS — K921 Melena: Secondary | ICD-10-CM | POA: Diagnosis not present

## 2019-10-16 DIAGNOSIS — E785 Hyperlipidemia, unspecified: Secondary | ICD-10-CM | POA: Diagnosis not present

## 2019-10-16 DIAGNOSIS — I4891 Unspecified atrial fibrillation: Secondary | ICD-10-CM | POA: Diagnosis not present

## 2019-10-16 DIAGNOSIS — R1013 Epigastric pain: Secondary | ICD-10-CM | POA: Diagnosis not present

## 2019-10-16 DIAGNOSIS — K209 Esophagitis, unspecified without bleeding: Secondary | ICD-10-CM | POA: Diagnosis not present

## 2019-10-16 DIAGNOSIS — R69 Illness, unspecified: Secondary | ICD-10-CM | POA: Diagnosis not present

## 2019-10-16 DIAGNOSIS — K269 Duodenal ulcer, unspecified as acute or chronic, without hemorrhage or perforation: Secondary | ICD-10-CM | POA: Diagnosis not present

## 2019-10-16 DIAGNOSIS — K922 Gastrointestinal hemorrhage, unspecified: Secondary | ICD-10-CM | POA: Diagnosis not present

## 2019-10-16 DIAGNOSIS — K208 Other esophagitis without bleeding: Secondary | ICD-10-CM | POA: Diagnosis not present

## 2019-10-16 DIAGNOSIS — R933 Abnormal findings on diagnostic imaging of other parts of digestive tract: Secondary | ICD-10-CM | POA: Diagnosis not present

## 2019-10-17 DIAGNOSIS — K921 Melena: Secondary | ICD-10-CM | POA: Diagnosis not present

## 2019-10-17 DIAGNOSIS — E785 Hyperlipidemia, unspecified: Secondary | ICD-10-CM | POA: Diagnosis not present

## 2019-10-17 DIAGNOSIS — R1013 Epigastric pain: Secondary | ICD-10-CM | POA: Diagnosis not present

## 2019-10-17 DIAGNOSIS — E1165 Type 2 diabetes mellitus with hyperglycemia: Secondary | ICD-10-CM | POA: Diagnosis not present

## 2019-10-18 DIAGNOSIS — M1990 Unspecified osteoarthritis, unspecified site: Secondary | ICD-10-CM | POA: Diagnosis not present

## 2019-10-18 DIAGNOSIS — E119 Type 2 diabetes mellitus without complications: Secondary | ICD-10-CM | POA: Diagnosis not present

## 2019-10-18 DIAGNOSIS — K2100 Gastro-esophageal reflux disease with esophagitis, without bleeding: Secondary | ICD-10-CM | POA: Diagnosis not present

## 2019-10-18 DIAGNOSIS — K2980 Duodenitis without bleeding: Secondary | ICD-10-CM | POA: Diagnosis not present

## 2019-10-18 DIAGNOSIS — D500 Iron deficiency anemia secondary to blood loss (chronic): Secondary | ICD-10-CM | POA: Diagnosis not present

## 2019-10-18 DIAGNOSIS — E785 Hyperlipidemia, unspecified: Secondary | ICD-10-CM | POA: Diagnosis not present

## 2019-10-18 DIAGNOSIS — K2091 Esophagitis, unspecified with bleeding: Secondary | ICD-10-CM | POA: Diagnosis not present

## 2019-10-18 DIAGNOSIS — R1013 Epigastric pain: Secondary | ICD-10-CM | POA: Diagnosis not present

## 2019-10-18 DIAGNOSIS — K228 Other specified diseases of esophagus: Secondary | ICD-10-CM | POA: Diagnosis not present

## 2019-10-18 DIAGNOSIS — K264 Chronic or unspecified duodenal ulcer with hemorrhage: Secondary | ICD-10-CM | POA: Diagnosis not present

## 2019-10-18 DIAGNOSIS — E1165 Type 2 diabetes mellitus with hyperglycemia: Secondary | ICD-10-CM | POA: Diagnosis not present

## 2019-10-18 DIAGNOSIS — Q2733 Arteriovenous malformation of digestive system vessel: Secondary | ICD-10-CM | POA: Diagnosis not present

## 2019-10-18 DIAGNOSIS — I4891 Unspecified atrial fibrillation: Secondary | ICD-10-CM | POA: Diagnosis not present

## 2019-10-18 DIAGNOSIS — K31819 Angiodysplasia of stomach and duodenum without bleeding: Secondary | ICD-10-CM | POA: Diagnosis not present

## 2019-10-18 DIAGNOSIS — Z794 Long term (current) use of insulin: Secondary | ICD-10-CM | POA: Diagnosis not present

## 2019-10-18 DIAGNOSIS — K922 Gastrointestinal hemorrhage, unspecified: Secondary | ICD-10-CM | POA: Diagnosis not present

## 2019-10-18 DIAGNOSIS — I4892 Unspecified atrial flutter: Secondary | ICD-10-CM | POA: Diagnosis not present

## 2019-10-18 DIAGNOSIS — T182XXA Foreign body in stomach, initial encounter: Secondary | ICD-10-CM | POA: Diagnosis not present

## 2019-10-18 DIAGNOSIS — I491 Atrial premature depolarization: Secondary | ICD-10-CM | POA: Diagnosis not present

## 2019-10-18 DIAGNOSIS — R933 Abnormal findings on diagnostic imaging of other parts of digestive tract: Secondary | ICD-10-CM | POA: Diagnosis not present

## 2019-10-18 DIAGNOSIS — K209 Esophagitis, unspecified without bleeding: Secondary | ICD-10-CM | POA: Diagnosis not present

## 2019-10-18 DIAGNOSIS — R69 Illness, unspecified: Secondary | ICD-10-CM | POA: Diagnosis not present

## 2019-10-18 DIAGNOSIS — D649 Anemia, unspecified: Secondary | ICD-10-CM | POA: Diagnosis not present

## 2019-10-18 DIAGNOSIS — G4733 Obstructive sleep apnea (adult) (pediatric): Secondary | ICD-10-CM | POA: Diagnosis not present

## 2019-10-18 DIAGNOSIS — K921 Melena: Secondary | ICD-10-CM | POA: Diagnosis not present

## 2019-10-18 DIAGNOSIS — Z20822 Contact with and (suspected) exposure to covid-19: Secondary | ICD-10-CM | POA: Diagnosis not present

## 2019-10-19 DIAGNOSIS — R1013 Epigastric pain: Secondary | ICD-10-CM | POA: Diagnosis not present

## 2019-10-19 DIAGNOSIS — E1165 Type 2 diabetes mellitus with hyperglycemia: Secondary | ICD-10-CM | POA: Diagnosis not present

## 2019-10-19 DIAGNOSIS — D649 Anemia, unspecified: Secondary | ICD-10-CM | POA: Diagnosis not present

## 2019-10-19 DIAGNOSIS — E785 Hyperlipidemia, unspecified: Secondary | ICD-10-CM | POA: Diagnosis not present

## 2019-10-19 DIAGNOSIS — K921 Melena: Secondary | ICD-10-CM | POA: Diagnosis not present

## 2019-10-25 DIAGNOSIS — E1165 Type 2 diabetes mellitus with hyperglycemia: Secondary | ICD-10-CM | POA: Diagnosis not present

## 2019-10-25 DIAGNOSIS — J9601 Acute respiratory failure with hypoxia: Secondary | ICD-10-CM | POA: Diagnosis not present

## 2019-10-25 DIAGNOSIS — I482 Chronic atrial fibrillation, unspecified: Secondary | ICD-10-CM | POA: Diagnosis not present

## 2019-10-25 DIAGNOSIS — M47812 Spondylosis without myelopathy or radiculopathy, cervical region: Secondary | ICD-10-CM | POA: Diagnosis not present

## 2019-10-25 DIAGNOSIS — E785 Hyperlipidemia, unspecified: Secondary | ICD-10-CM | POA: Diagnosis not present

## 2019-10-25 DIAGNOSIS — M1991 Primary osteoarthritis, unspecified site: Secondary | ICD-10-CM | POA: Diagnosis not present

## 2019-10-25 DIAGNOSIS — R69 Illness, unspecified: Secondary | ICD-10-CM | POA: Diagnosis not present

## 2019-10-25 DIAGNOSIS — J188 Other pneumonia, unspecified organism: Secondary | ICD-10-CM | POA: Diagnosis not present

## 2019-10-25 DIAGNOSIS — M533 Sacrococcygeal disorders, not elsewhere classified: Secondary | ICD-10-CM | POA: Diagnosis not present

## 2019-10-25 DIAGNOSIS — G4733 Obstructive sleep apnea (adult) (pediatric): Secondary | ICD-10-CM | POA: Diagnosis not present

## 2019-10-26 ENCOUNTER — Encounter: Payer: Self-pay | Admitting: General Practice

## 2019-10-27 ENCOUNTER — Other Ambulatory Visit: Payer: Self-pay | Admitting: Cardiology

## 2019-10-27 NOTE — Telephone Encounter (Signed)
°*  STAT* If patient is at the pharmacy, call can be transferred to refill team.   1. Which medications need to be refilled? (please list name of each medication and dose if known)  Rosuvastatin  2. Which pharmacy/location (including street and city if local pharmacy) is medication to be sent to? [facility]   3. Do they need a 30 day or 90 day supply? Not sure

## 2019-11-01 DIAGNOSIS — I4892 Unspecified atrial flutter: Secondary | ICD-10-CM | POA: Diagnosis not present

## 2019-11-01 DIAGNOSIS — Z4509 Encounter for adjustment and management of other cardiac device: Secondary | ICD-10-CM | POA: Diagnosis not present

## 2019-11-02 DIAGNOSIS — I4892 Unspecified atrial flutter: Secondary | ICD-10-CM | POA: Diagnosis not present

## 2019-11-02 DIAGNOSIS — Z4509 Encounter for adjustment and management of other cardiac device: Secondary | ICD-10-CM | POA: Diagnosis not present

## 2019-11-10 DIAGNOSIS — J9611 Chronic respiratory failure with hypoxia: Secondary | ICD-10-CM | POA: Diagnosis not present

## 2019-11-15 DIAGNOSIS — R69 Illness, unspecified: Secondary | ICD-10-CM | POA: Diagnosis not present

## 2019-11-18 DIAGNOSIS — R69 Illness, unspecified: Secondary | ICD-10-CM | POA: Diagnosis not present

## 2019-11-30 DIAGNOSIS — G8929 Other chronic pain: Secondary | ICD-10-CM | POA: Diagnosis not present

## 2019-11-30 DIAGNOSIS — E1165 Type 2 diabetes mellitus with hyperglycemia: Secondary | ICD-10-CM | POA: Diagnosis not present

## 2019-11-30 DIAGNOSIS — D6869 Other thrombophilia: Secondary | ICD-10-CM | POA: Diagnosis not present

## 2019-11-30 DIAGNOSIS — I10 Essential (primary) hypertension: Secondary | ICD-10-CM | POA: Diagnosis not present

## 2019-11-30 DIAGNOSIS — Z794 Long term (current) use of insulin: Secondary | ICD-10-CM | POA: Diagnosis not present

## 2019-11-30 DIAGNOSIS — I4891 Unspecified atrial fibrillation: Secondary | ICD-10-CM | POA: Diagnosis not present

## 2019-11-30 DIAGNOSIS — J449 Chronic obstructive pulmonary disease, unspecified: Secondary | ICD-10-CM | POA: Diagnosis not present

## 2019-11-30 DIAGNOSIS — E785 Hyperlipidemia, unspecified: Secondary | ICD-10-CM | POA: Diagnosis not present

## 2019-11-30 DIAGNOSIS — G3184 Mild cognitive impairment, so stated: Secondary | ICD-10-CM | POA: Diagnosis not present

## 2019-11-30 DIAGNOSIS — R69 Illness, unspecified: Secondary | ICD-10-CM | POA: Diagnosis not present

## 2019-11-30 DIAGNOSIS — Z008 Encounter for other general examination: Secondary | ICD-10-CM | POA: Diagnosis not present

## 2019-12-02 DIAGNOSIS — Z95818 Presence of other cardiac implants and grafts: Secondary | ICD-10-CM | POA: Diagnosis not present

## 2019-12-02 DIAGNOSIS — Z4509 Encounter for adjustment and management of other cardiac device: Secondary | ICD-10-CM | POA: Diagnosis not present

## 2019-12-02 DIAGNOSIS — I4892 Unspecified atrial flutter: Secondary | ICD-10-CM | POA: Diagnosis not present

## 2019-12-04 DIAGNOSIS — I4892 Unspecified atrial flutter: Secondary | ICD-10-CM | POA: Diagnosis not present

## 2019-12-04 DIAGNOSIS — Z4509 Encounter for adjustment and management of other cardiac device: Secondary | ICD-10-CM | POA: Diagnosis not present

## 2019-12-08 DIAGNOSIS — R69 Illness, unspecified: Secondary | ICD-10-CM | POA: Diagnosis not present

## 2019-12-09 IMAGING — CT CT CHEST LUNG CANCER SCREENING LOW DOSE W/O CM
1 series · 10 of 10 positions shown, 13 images · non-contrast
Comparison: 07/15/2017 chest radiograph.

CLINICAL DATA: 66-year-old asymptomatic male current smoker with 50
smoking history.

EXAM:
CT CHEST WITHOUT CONTRAST LOW-DOSE FOR LUNG CANCER SCREENING
TECHNIQUE: Multidetector CT imaging of the chest was performed following the
standard protocol without IV contrast.

[ct lung segmentation data · axial · 0.78mm/px · z∈[-323,-323]mm · 10 of 308 frames shown]
[frame 1/308  mediastinal]
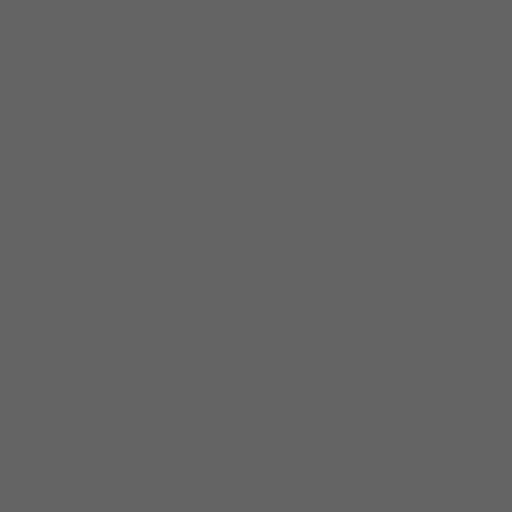
[frame 1/308  lung]
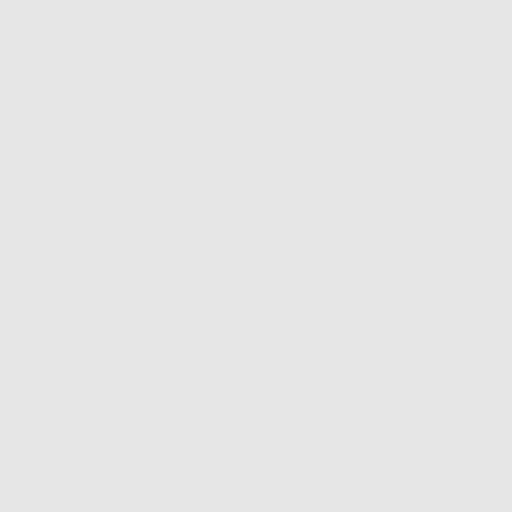
[frame 35/308  lung]
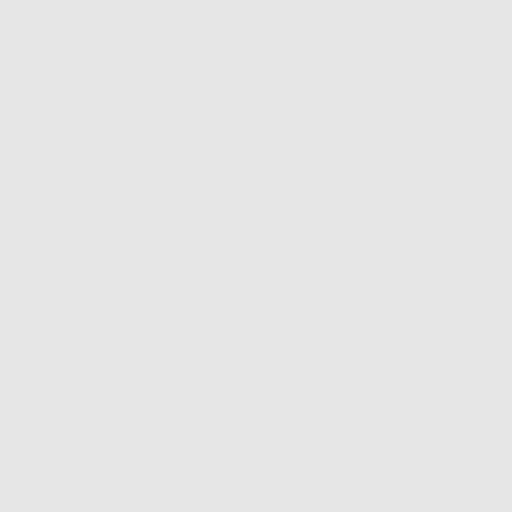
[frame 69/308  lung]
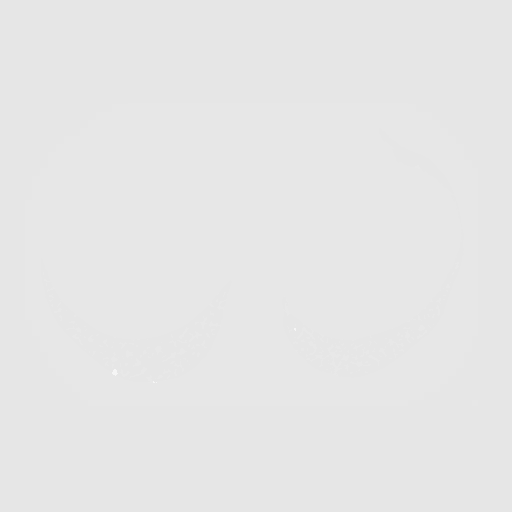
[frame 103/308  lung]
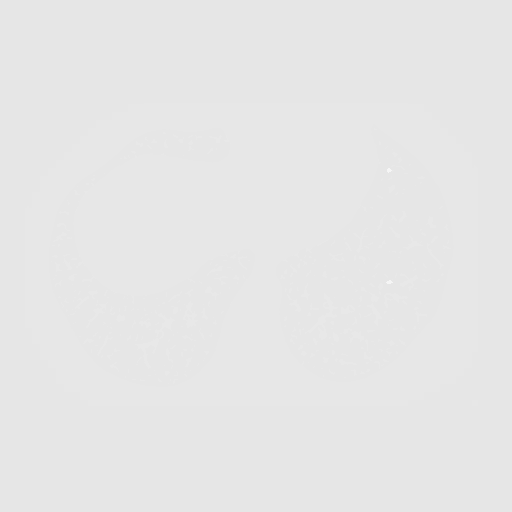
[frame 137/308  mediastinal]
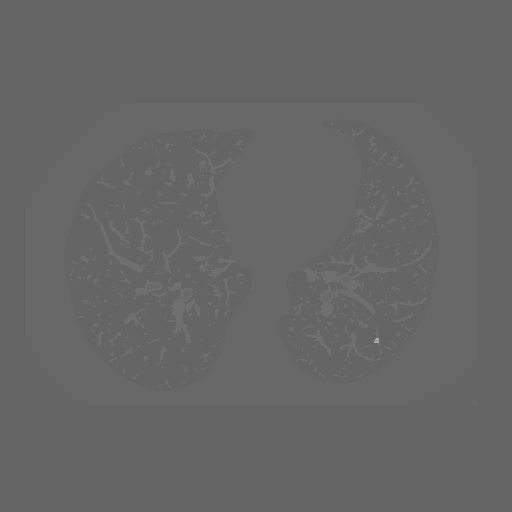
[frame 137/308  lung]
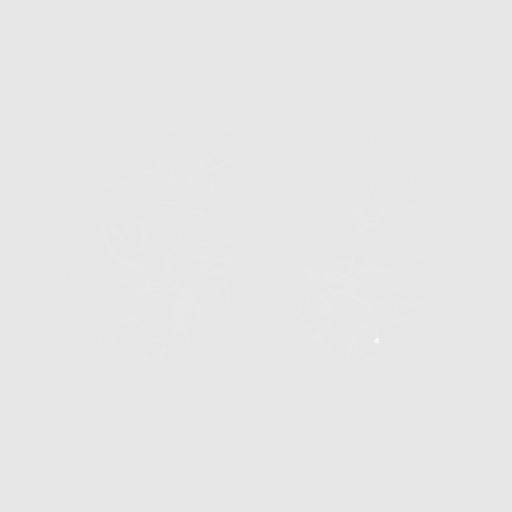
[frame 171/308  lung]
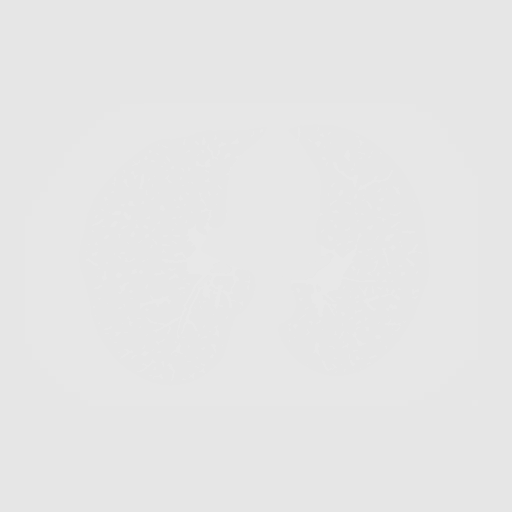
[frame 205/308  lung]
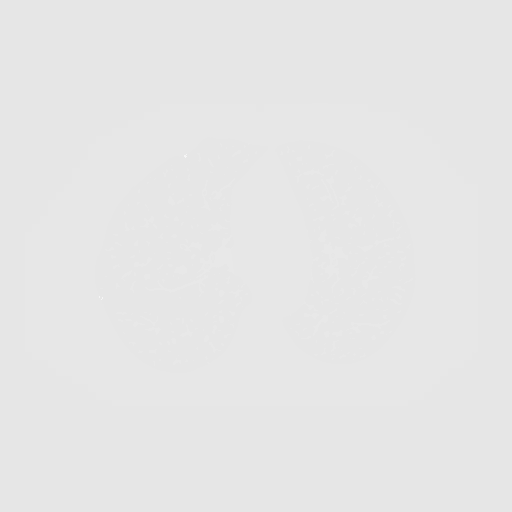
[frame 239/308  lung]
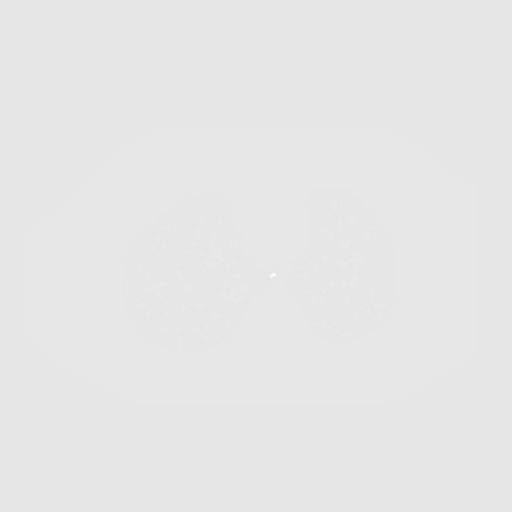
[frame 273/308  mediastinal]
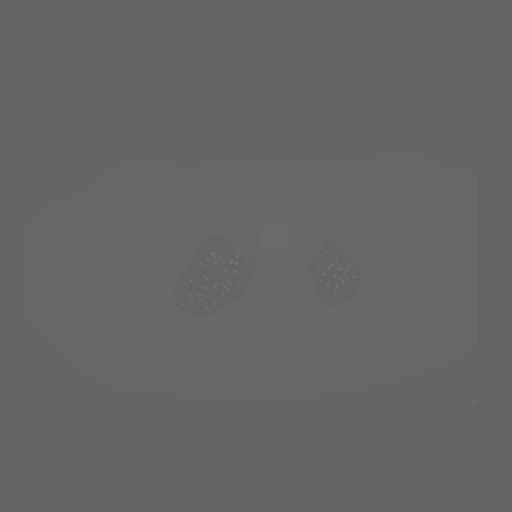
[frame 273/308  lung]
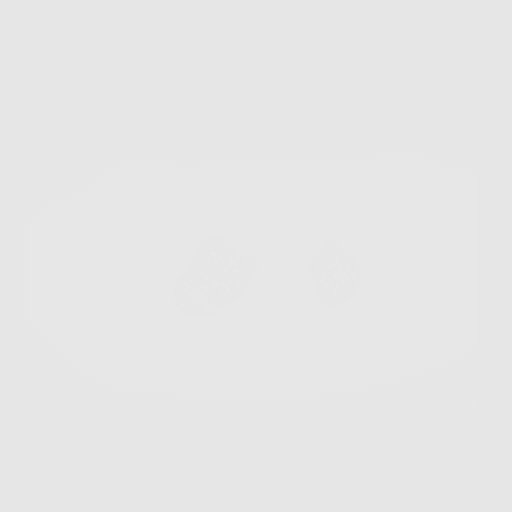
[frame 308/308  lung]
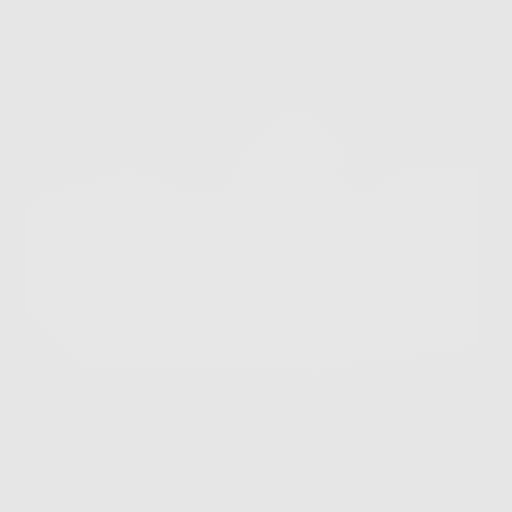

[10 of 10 positions shown; findings below may reference images not displayed]

FINDINGS: Cardiovascular: Normal heart size. No significant pericardial
effusion/thickening. Three-vessel coronary atherosclerosis.
Atherosclerotic nonaneurysmal thoracic aorta. Normal caliber
pulmonary arteries.

Mediastinum/Nodes: No discrete thyroid nodules. Unremarkable
esophagus. No pathologically enlarged axillary, mediastinal or hilar
lymph nodes, noting limited sensitivity for the detection of hilar
adenopathy on this noncontrast study.

Lungs/Pleura: No pneumothorax. No pleural effusion. Mild
centrilobular and paraseptal emphysema. No acute consolidative
airspace disease or lung masses. Two small scattered pulmonary
nodules in the left lung, largest 4.3 mm in volume derived mean
diameter in the anterior left lower lobe (series 3/image 155).

Upper abdomen: Cholelithiasis.

Musculoskeletal: No aggressive appearing focal osseous lesions. Mild
thoracic spondylosis.
IMPRESSION: 1. Lung-RADS 2, benign appearance or behavior. Continue annual
screening with low-dose chest CT without contrast in 12 months.
2. Three-vessel coronary atherosclerosis.
3. Cholelithiasis.

Aortic Atherosclerosis (3DN2U-GWD.D) and Emphysema (3DN2U-NSX.A).

## 2019-12-11 DIAGNOSIS — J9611 Chronic respiratory failure with hypoxia: Secondary | ICD-10-CM | POA: Diagnosis not present

## 2019-12-29 ENCOUNTER — Telehealth: Payer: Self-pay | Admitting: Cardiology

## 2019-12-29 NOTE — Telephone Encounter (Signed)
Pt called In state he rec'd a Blood Glucose Monitoring Suppl [facility] VERIO) w/Device KIT [[id number]] From " Dr Stanford Breed office" he thinks?  He is needing a script for the lantus for this machine and not sure who to call to get it? He rec'd it in nov.      [place] [address], [place]  [place], [place] [address]  Phone:  [phone] Fax:  [phone]

## 2019-12-29 NOTE — Telephone Encounter (Signed)
Spoke with patient. Patient to contact PCP for diabetic supplies.

## 2019-12-31 DIAGNOSIS — R69 Illness, unspecified: Secondary | ICD-10-CM | POA: Diagnosis not present

## 2020-01-02 DIAGNOSIS — Z4509 Encounter for adjustment and management of other cardiac device: Secondary | ICD-10-CM | POA: Diagnosis not present

## 2020-01-02 DIAGNOSIS — I4892 Unspecified atrial flutter: Secondary | ICD-10-CM | POA: Diagnosis not present

## 2020-01-04 DIAGNOSIS — I4892 Unspecified atrial flutter: Secondary | ICD-10-CM | POA: Diagnosis not present

## 2020-01-04 DIAGNOSIS — Z95818 Presence of other cardiac implants and grafts: Secondary | ICD-10-CM | POA: Diagnosis not present

## 2020-01-09 DIAGNOSIS — R69 Illness, unspecified: Secondary | ICD-10-CM | POA: Diagnosis not present

## 2020-01-10 DIAGNOSIS — J9611 Chronic respiratory failure with hypoxia: Secondary | ICD-10-CM | POA: Diagnosis not present

## 2020-01-15 DIAGNOSIS — R69 Illness, unspecified: Secondary | ICD-10-CM | POA: Diagnosis not present

## 2020-02-02 DIAGNOSIS — Z95818 Presence of other cardiac implants and grafts: Secondary | ICD-10-CM | POA: Diagnosis not present

## 2020-02-02 DIAGNOSIS — I4892 Unspecified atrial flutter: Secondary | ICD-10-CM | POA: Diagnosis not present

## 2020-02-02 DIAGNOSIS — Z4509 Encounter for adjustment and management of other cardiac device: Secondary | ICD-10-CM | POA: Diagnosis not present

## 2020-02-05 DIAGNOSIS — Z95818 Presence of other cardiac implants and grafts: Secondary | ICD-10-CM | POA: Diagnosis not present

## 2020-02-05 DIAGNOSIS — I4892 Unspecified atrial flutter: Secondary | ICD-10-CM | POA: Diagnosis not present

## 2020-02-10 DIAGNOSIS — J9611 Chronic respiratory failure with hypoxia: Secondary | ICD-10-CM | POA: Diagnosis not present

## 2020-02-13 DIAGNOSIS — Z794 Long term (current) use of insulin: Secondary | ICD-10-CM | POA: Diagnosis not present

## 2020-02-13 DIAGNOSIS — I4892 Unspecified atrial flutter: Secondary | ICD-10-CM | POA: Diagnosis not present

## 2020-02-13 DIAGNOSIS — J984 Other disorders of lung: Secondary | ICD-10-CM | POA: Diagnosis not present

## 2020-02-13 DIAGNOSIS — I4891 Unspecified atrial fibrillation: Secondary | ICD-10-CM | POA: Diagnosis not present

## 2020-02-13 DIAGNOSIS — R404 Transient alteration of awareness: Secondary | ICD-10-CM | POA: Diagnosis not present

## 2020-02-13 DIAGNOSIS — J1282 Pneumonia due to coronavirus disease 2019: Secondary | ICD-10-CM | POA: Diagnosis not present

## 2020-02-13 DIAGNOSIS — W1830XA Fall on same level, unspecified, initial encounter: Secondary | ICD-10-CM | POA: Diagnosis not present

## 2020-02-13 DIAGNOSIS — M542 Cervicalgia: Secondary | ICD-10-CM | POA: Diagnosis not present

## 2020-02-13 DIAGNOSIS — I493 Ventricular premature depolarization: Secondary | ICD-10-CM | POA: Diagnosis not present

## 2020-02-13 DIAGNOSIS — M79631 Pain in right forearm: Secondary | ICD-10-CM | POA: Diagnosis not present

## 2020-02-13 DIAGNOSIS — M546 Pain in thoracic spine: Secondary | ICD-10-CM | POA: Diagnosis not present

## 2020-02-13 DIAGNOSIS — T1490XA Injury, unspecified, initial encounter: Secondary | ICD-10-CM | POA: Diagnosis not present

## 2020-02-13 DIAGNOSIS — M549 Dorsalgia, unspecified: Secondary | ICD-10-CM | POA: Diagnosis not present

## 2020-02-13 DIAGNOSIS — R269 Unspecified abnormalities of gait and mobility: Secondary | ICD-10-CM | POA: Diagnosis not present

## 2020-02-13 DIAGNOSIS — Y9389 Activity, other specified: Secondary | ICD-10-CM | POA: Diagnosis not present

## 2020-02-13 DIAGNOSIS — J9601 Acute respiratory failure with hypoxia: Secondary | ICD-10-CM | POA: Diagnosis not present

## 2020-02-13 DIAGNOSIS — R931 Abnormal findings on diagnostic imaging of heart and coronary circulation: Secondary | ICD-10-CM | POA: Diagnosis not present

## 2020-02-13 DIAGNOSIS — I471 Supraventricular tachycardia: Secondary | ICD-10-CM | POA: Diagnosis not present

## 2020-02-13 DIAGNOSIS — M25531 Pain in right wrist: Secondary | ICD-10-CM | POA: Diagnosis not present

## 2020-02-13 DIAGNOSIS — S06829A Injury of left internal carotid artery, intracranial portion, not elsewhere classified with loss of consciousness of unspecified duration, initial encounter: Secondary | ICD-10-CM | POA: Diagnosis not present

## 2020-02-13 DIAGNOSIS — R55 Syncope and collapse: Secondary | ICD-10-CM | POA: Diagnosis not present

## 2020-02-13 DIAGNOSIS — I444 Left anterior fascicular block: Secondary | ICD-10-CM | POA: Diagnosis not present

## 2020-02-13 DIAGNOSIS — I5A Non-ischemic myocardial injury (non-traumatic): Secondary | ICD-10-CM | POA: Diagnosis not present

## 2020-02-13 DIAGNOSIS — Z743 Need for continuous supervision: Secondary | ICD-10-CM | POA: Diagnosis not present

## 2020-02-13 DIAGNOSIS — S01112A Laceration without foreign body of left eyelid and periocular area, initial encounter: Secondary | ICD-10-CM | POA: Diagnosis not present

## 2020-02-13 DIAGNOSIS — U071 COVID-19: Secondary | ICD-10-CM | POA: Diagnosis not present

## 2020-02-13 DIAGNOSIS — I472 Ventricular tachycardia: Secondary | ICD-10-CM | POA: Diagnosis not present

## 2020-02-13 DIAGNOSIS — K118 Other diseases of salivary glands: Secondary | ICD-10-CM | POA: Diagnosis not present

## 2020-02-13 DIAGNOSIS — R918 Other nonspecific abnormal finding of lung field: Secondary | ICD-10-CM | POA: Diagnosis not present

## 2020-02-13 DIAGNOSIS — E1110 Type 2 diabetes mellitus with ketoacidosis without coma: Secondary | ICD-10-CM | POA: Diagnosis not present

## 2020-02-13 DIAGNOSIS — E1165 Type 2 diabetes mellitus with hyperglycemia: Secondary | ICD-10-CM | POA: Diagnosis not present

## 2020-02-13 DIAGNOSIS — Y92002 Bathroom of unspecified non-institutional (private) residence single-family (private) house as the place of occurrence of the external cause: Secondary | ICD-10-CM | POA: Diagnosis not present

## 2020-02-13 DIAGNOSIS — I491 Atrial premature depolarization: Secondary | ICD-10-CM | POA: Diagnosis not present

## 2020-02-13 DIAGNOSIS — E860 Dehydration: Secondary | ICD-10-CM | POA: Diagnosis not present

## 2020-02-13 DIAGNOSIS — R69 Illness, unspecified: Secondary | ICD-10-CM | POA: Diagnosis not present

## 2020-02-13 DIAGNOSIS — M25521 Pain in right elbow: Secondary | ICD-10-CM | POA: Diagnosis not present

## 2020-02-13 DIAGNOSIS — R58 Hemorrhage, not elsewhere classified: Secondary | ICD-10-CM | POA: Diagnosis not present

## 2020-02-13 DIAGNOSIS — M79641 Pain in right hand: Secondary | ICD-10-CM | POA: Diagnosis not present

## 2020-02-13 DIAGNOSIS — Z043 Encounter for examination and observation following other accident: Secondary | ICD-10-CM | POA: Diagnosis not present

## 2020-02-13 DIAGNOSIS — D696 Thrombocytopenia, unspecified: Secondary | ICD-10-CM | POA: Diagnosis not present

## 2020-02-13 DIAGNOSIS — W010XXA Fall on same level from slipping, tripping and stumbling without subsequent striking against object, initial encounter: Secondary | ICD-10-CM | POA: Diagnosis not present

## 2020-02-14 DIAGNOSIS — J9601 Acute respiratory failure with hypoxia: Secondary | ICD-10-CM | POA: Diagnosis not present

## 2020-02-14 DIAGNOSIS — Z794 Long term (current) use of insulin: Secondary | ICD-10-CM | POA: Diagnosis not present

## 2020-02-14 DIAGNOSIS — I4892 Unspecified atrial flutter: Secondary | ICD-10-CM | POA: Diagnosis not present

## 2020-02-14 DIAGNOSIS — U071 COVID-19: Secondary | ICD-10-CM | POA: Diagnosis not present

## 2020-02-14 DIAGNOSIS — R55 Syncope and collapse: Secondary | ICD-10-CM | POA: Diagnosis not present

## 2020-02-14 DIAGNOSIS — E1165 Type 2 diabetes mellitus with hyperglycemia: Secondary | ICD-10-CM | POA: Diagnosis not present

## 2020-02-14 DIAGNOSIS — I471 Supraventricular tachycardia: Secondary | ICD-10-CM | POA: Diagnosis not present

## 2020-02-14 DIAGNOSIS — I472 Ventricular tachycardia: Secondary | ICD-10-CM | POA: Diagnosis not present

## 2020-02-14 DIAGNOSIS — J1282 Pneumonia due to coronavirus disease 2019: Secondary | ICD-10-CM | POA: Diagnosis not present

## 2020-02-14 DIAGNOSIS — I493 Ventricular premature depolarization: Secondary | ICD-10-CM | POA: Diagnosis not present

## 2020-02-14 DIAGNOSIS — R918 Other nonspecific abnormal finding of lung field: Secondary | ICD-10-CM | POA: Diagnosis not present

## 2020-02-15 DIAGNOSIS — R55 Syncope and collapse: Secondary | ICD-10-CM | POA: Diagnosis not present

## 2020-02-15 DIAGNOSIS — J9601 Acute respiratory failure with hypoxia: Secondary | ICD-10-CM | POA: Diagnosis not present

## 2020-02-15 DIAGNOSIS — U071 COVID-19: Secondary | ICD-10-CM | POA: Diagnosis not present

## 2020-02-15 DIAGNOSIS — M542 Cervicalgia: Secondary | ICD-10-CM | POA: Diagnosis not present

## 2020-02-15 DIAGNOSIS — J1282 Pneumonia due to coronavirus disease 2019: Secondary | ICD-10-CM | POA: Diagnosis not present

## 2020-02-16 DIAGNOSIS — R55 Syncope and collapse: Secondary | ICD-10-CM | POA: Diagnosis not present

## 2020-02-16 DIAGNOSIS — I472 Ventricular tachycardia: Secondary | ICD-10-CM | POA: Diagnosis not present

## 2020-02-16 DIAGNOSIS — R778 Other specified abnormalities of plasma proteins: Secondary | ICD-10-CM | POA: Diagnosis not present

## 2020-02-16 DIAGNOSIS — U071 COVID-19: Secondary | ICD-10-CM | POA: Diagnosis not present

## 2021-04-01 ENCOUNTER — Ambulatory Visit: Payer: Medicare HMO | Admitting: Podiatry

## 2021-04-04 ENCOUNTER — Ambulatory Visit: Payer: Medicare HMO | Admitting: Podiatry
